# Patient Record
Sex: Male | Born: 1950 | Race: White | Hispanic: No | Marital: Single | Smoking: Never smoker
Health system: Southern US, Community
[De-identification: ages and names within clinical notes are randomized; demographics above are authoritative.]

## PROBLEM LIST (undated history)

## (undated) DIAGNOSIS — S069X9A Unspecified intracranial injury with loss of consciousness of unspecified duration, initial encounter: Secondary | ICD-10-CM

## (undated) DIAGNOSIS — S069XAA Unspecified intracranial injury with loss of consciousness status unknown, initial encounter: Secondary | ICD-10-CM

## (undated) DIAGNOSIS — F29 Unspecified psychosis not due to a substance or known physiological condition: Secondary | ICD-10-CM

## (undated) DIAGNOSIS — I10 Essential (primary) hypertension: Secondary | ICD-10-CM

## (undated) DIAGNOSIS — F419 Anxiety disorder, unspecified: Secondary | ICD-10-CM

## (undated) HISTORY — PX: BRAIN SURGERY: SHX531

## (undated) HISTORY — DX: Unspecified psychosis not due to a substance or known physiological condition: F29

## (undated) HISTORY — PX: COLONOSCOPY: SHX174

## (undated) HISTORY — DX: Essential (primary) hypertension: I10

---

## 2006-10-13 DIAGNOSIS — S82409A Unspecified fracture of shaft of unspecified fibula, initial encounter for closed fracture: Secondary | ICD-10-CM | POA: Insufficient documentation

## 2007-01-16 DIAGNOSIS — Z8719 Personal history of other diseases of the digestive system: Secondary | ICD-10-CM

## 2007-02-03 LAB — CONVERTED CEMR LAB
BUN: 16 mg/dL
Creatinine, Ser: 1.09 mg/dL
Eosinophils Absolute: 7.8 10*3/uL
Lymphocytes Relative: 27.2 %
Lymphs Abs: 2.9 10*3/uL
MCV: 88.1 fL
Platelets: 296 10*3/uL
Sodium: 141 meq/L
Triglycerides: 163 mg/dL
WBC: 10.8 10*3/uL

## 2007-02-24 ENCOUNTER — Encounter: Payer: Self-pay | Admitting: Gastroenterology

## 2007-05-01 ENCOUNTER — Encounter (INDEPENDENT_AMBULATORY_CARE_PROVIDER_SITE_OTHER): Payer: Self-pay | Admitting: Nurse Practitioner

## 2007-05-29 ENCOUNTER — Ambulatory Visit: Payer: Self-pay | Admitting: Nurse Practitioner

## 2007-05-29 DIAGNOSIS — F79 Unspecified intellectual disabilities: Secondary | ICD-10-CM

## 2007-05-29 DIAGNOSIS — I10 Essential (primary) hypertension: Secondary | ICD-10-CM

## 2007-05-29 DIAGNOSIS — E119 Type 2 diabetes mellitus without complications: Secondary | ICD-10-CM

## 2007-05-29 DIAGNOSIS — G939 Disorder of brain, unspecified: Secondary | ICD-10-CM | POA: Insufficient documentation

## 2007-05-29 LAB — CONVERTED CEMR LAB
Albumin: 4.3 g/dL (ref 3.5–5.2)
Alkaline Phosphatase: 89 units/L (ref 39–117)
BUN: 13 mg/dL (ref 6–23)
Basophils Relative: 0 % (ref 0–1)
Blood Glucose, Fingerstick: 82
CO2: 21 meq/L (ref 19–32)
Chloride: 109 meq/L (ref 96–112)
Creatinine, Ser: 1.06 mg/dL (ref 0.40–1.50)
Glucose, Bld: 67 mg/dL — ABNORMAL LOW (ref 70–99)
HCT: 47.6 % (ref 39.0–52.0)
HDL: 29 mg/dL — ABNORMAL LOW (ref >39)
Hgb A1c MFr Bld: 4.7 %
LDL Cholesterol: 67 mg/dL (ref 0–99)
Lymphs Abs: 2.9 10*3/uL (ref 0.7–4.0)
Microalb, Ur: 1.37 mg/dL (ref 0.00–1.89)
Monocytes Absolute: 0.7 10*3/uL (ref 0.1–1.0)
Monocytes Relative: 6 % (ref 3–12)
Potassium: 4 meq/L (ref 3.5–5.3)
RBC: 5.13 M/uL (ref 4.22–5.81)
RDW: 13.9 % (ref 11.5–15.5)
Total Bilirubin: 0.5 mg/dL (ref 0.3–1.2)
Total CHOL/HDL Ratio: 4.1
Total Protein: 7.3 g/dL (ref 6.0–8.3)
WBC: 10.3 10*3/uL (ref 4.0–10.5)

## 2007-05-30 ENCOUNTER — Encounter (INDEPENDENT_AMBULATORY_CARE_PROVIDER_SITE_OTHER): Payer: Self-pay | Admitting: Nurse Practitioner

## 2007-06-06 ENCOUNTER — Encounter (INDEPENDENT_AMBULATORY_CARE_PROVIDER_SITE_OTHER): Payer: Self-pay | Admitting: Nurse Practitioner

## 2007-07-05 ENCOUNTER — Encounter (INDEPENDENT_AMBULATORY_CARE_PROVIDER_SITE_OTHER): Payer: Self-pay | Admitting: Nurse Practitioner

## 2007-08-03 ENCOUNTER — Encounter (INDEPENDENT_AMBULATORY_CARE_PROVIDER_SITE_OTHER): Payer: Self-pay | Admitting: Nurse Practitioner

## 2007-09-04 ENCOUNTER — Encounter (INDEPENDENT_AMBULATORY_CARE_PROVIDER_SITE_OTHER): Payer: Self-pay | Admitting: Nurse Practitioner

## 2007-10-06 ENCOUNTER — Ambulatory Visit: Payer: Self-pay | Admitting: Nurse Practitioner

## 2007-10-06 LAB — CONVERTED CEMR LAB
Alkaline Phosphatase: 81 units/L (ref 39–117)
Basophils Relative: 0 % (ref 0–1)
CO2: 25 meq/L (ref 19–32)
Calcium: 9.5 mg/dL (ref 8.4–10.5)
Eosinophils Absolute: 0.2 10*3/uL (ref 0.0–0.7)
Eosinophils Relative: 1 % (ref 0–5)
HCT: 43.8 % (ref 39.0–52.0)
Hemoglobin: 14.5 g/dL (ref 13.0–17.0)
Lymphs Abs: 2.5 10*3/uL (ref 0.7–4.0)
MCHC: 33.1 g/dL (ref 30.0–36.0)
MCV: 90.7 fL (ref 78.0–100.0)
Monocytes Relative: 6 % (ref 3–12)
Neutro Abs: 7.2 10*3/uL (ref 1.7–7.7)
Neutrophils Relative %: 69 % (ref 43–77)
Platelets: 276 10*3/uL (ref 150–400)
RBC: 4.83 M/uL (ref 4.22–5.81)
RDW: 13.3 % (ref 11.5–15.5)
Sodium: 141 meq/L (ref 135–145)
Total Protein: 7.2 g/dL (ref 6.0–8.3)

## 2007-10-08 DIAGNOSIS — K5909 Other constipation: Secondary | ICD-10-CM

## 2007-10-09 ENCOUNTER — Encounter (INDEPENDENT_AMBULATORY_CARE_PROVIDER_SITE_OTHER): Payer: Self-pay | Admitting: Nurse Practitioner

## 2007-10-10 ENCOUNTER — Encounter (INDEPENDENT_AMBULATORY_CARE_PROVIDER_SITE_OTHER): Payer: Self-pay | Admitting: Internal Medicine

## 2007-11-27 ENCOUNTER — Ambulatory Visit: Payer: Self-pay | Admitting: Nurse Practitioner

## 2007-11-27 DIAGNOSIS — R822 Biliuria: Secondary | ICD-10-CM

## 2007-11-27 DIAGNOSIS — E669 Obesity, unspecified: Secondary | ICD-10-CM

## 2007-11-27 LAB — CONVERTED CEMR LAB
AST: 17 units/L (ref 0–37)
Albumin: 4.1 g/dL (ref 3.5–5.2)
Alkaline Phosphatase: 78 units/L (ref 39–117)
Bilirubin, Direct: 0.2 mg/dL (ref 0.0–0.3)
HDL: 27 mg/dL — ABNORMAL LOW (ref >39)
Hep A Total Ab: NEGATIVE
Indirect Bilirubin: 0.5 mg/dL (ref 0.0–0.9)
LDL Cholesterol: 72 mg/dL (ref 0–99)
Nitrite: NEGATIVE
OCCULT 1: NEGATIVE
PSA: 1.32 ng/mL (ref 0.10–4.00)
TSH: 1.097 microintl units/mL (ref 0.350–4.50)
Total Bilirubin: 0.7 mg/dL (ref 0.3–1.2)
Total Protein: 7 g/dL (ref 6.0–8.3)
Triglycerides: 91 mg/dL (ref <150)
VLDL: 18 mg/dL (ref 0–40)

## 2007-11-28 ENCOUNTER — Telehealth (INDEPENDENT_AMBULATORY_CARE_PROVIDER_SITE_OTHER): Payer: Self-pay | Admitting: Nurse Practitioner

## 2007-11-30 ENCOUNTER — Encounter (INDEPENDENT_AMBULATORY_CARE_PROVIDER_SITE_OTHER): Payer: Self-pay | Admitting: Nurse Practitioner

## 2007-12-11 ENCOUNTER — Encounter (INDEPENDENT_AMBULATORY_CARE_PROVIDER_SITE_OTHER): Payer: Self-pay | Admitting: Internal Medicine

## 2008-02-02 ENCOUNTER — Encounter (INDEPENDENT_AMBULATORY_CARE_PROVIDER_SITE_OTHER): Payer: Self-pay | Admitting: Nurse Practitioner

## 2008-02-06 ENCOUNTER — Encounter: Payer: Self-pay | Admitting: Gastroenterology

## 2008-02-07 ENCOUNTER — Encounter (INDEPENDENT_AMBULATORY_CARE_PROVIDER_SITE_OTHER): Payer: Self-pay | Admitting: Nurse Practitioner

## 2008-02-20 ENCOUNTER — Telehealth (INDEPENDENT_AMBULATORY_CARE_PROVIDER_SITE_OTHER): Payer: Self-pay | Admitting: Nurse Practitioner

## 2008-02-22 ENCOUNTER — Encounter (INDEPENDENT_AMBULATORY_CARE_PROVIDER_SITE_OTHER): Payer: Self-pay | Admitting: Nurse Practitioner

## 2008-03-11 ENCOUNTER — Ambulatory Visit: Payer: Self-pay | Admitting: Gastroenterology

## 2008-03-13 ENCOUNTER — Telehealth: Payer: Self-pay | Admitting: Gastroenterology

## 2008-03-14 ENCOUNTER — Encounter (INDEPENDENT_AMBULATORY_CARE_PROVIDER_SITE_OTHER): Payer: Self-pay | Admitting: Nurse Practitioner

## 2008-04-02 ENCOUNTER — Telehealth: Payer: Self-pay | Admitting: Gastroenterology

## 2008-04-03 ENCOUNTER — Encounter (INDEPENDENT_AMBULATORY_CARE_PROVIDER_SITE_OTHER): Payer: Self-pay | Admitting: Nurse Practitioner

## 2008-04-17 ENCOUNTER — Encounter (INDEPENDENT_AMBULATORY_CARE_PROVIDER_SITE_OTHER): Payer: Self-pay | Admitting: *Deleted

## 2008-04-17 ENCOUNTER — Telehealth: Payer: Self-pay | Admitting: Gastroenterology

## 2008-04-29 ENCOUNTER — Encounter (INDEPENDENT_AMBULATORY_CARE_PROVIDER_SITE_OTHER): Payer: Self-pay | Admitting: Nurse Practitioner

## 2008-05-27 ENCOUNTER — Ambulatory Visit: Payer: Self-pay | Admitting: Nurse Practitioner

## 2008-05-27 DIAGNOSIS — Z862 Personal history of diseases of the blood and blood-forming organs and certain disorders involving the immune mechanism: Secondary | ICD-10-CM

## 2008-05-27 DIAGNOSIS — Z8639 Personal history of other endocrine, nutritional and metabolic disease: Secondary | ICD-10-CM

## 2008-05-27 LAB — CONVERTED CEMR LAB: Blood Glucose, Fingerstick: 87

## 2008-05-29 ENCOUNTER — Telehealth (INDEPENDENT_AMBULATORY_CARE_PROVIDER_SITE_OTHER): Payer: Self-pay | Admitting: Nurse Practitioner

## 2008-06-03 LAB — CONVERTED CEMR LAB
ALT: 14 units/L (ref 0–53)
Albumin: 4.1 g/dL (ref 3.5–5.2)
BUN: 22 mg/dL (ref 6–23)
CO2: 22 meq/L (ref 19–32)
Creatinine, Ser: 0.97 mg/dL (ref 0.40–1.50)
Potassium: 4.9 meq/L (ref 3.5–5.3)
Sodium: 148 meq/L — ABNORMAL HIGH (ref 135–145)

## 2008-06-11 ENCOUNTER — Encounter (INDEPENDENT_AMBULATORY_CARE_PROVIDER_SITE_OTHER): Payer: Self-pay | Admitting: Nurse Practitioner

## 2008-10-07 ENCOUNTER — Ambulatory Visit: Payer: Self-pay | Admitting: Nurse Practitioner

## 2008-10-10 ENCOUNTER — Telehealth (INDEPENDENT_AMBULATORY_CARE_PROVIDER_SITE_OTHER): Payer: Self-pay | Admitting: Nurse Practitioner

## 2008-10-18 ENCOUNTER — Ambulatory Visit: Payer: Self-pay | Admitting: Nurse Practitioner

## 2008-10-21 ENCOUNTER — Ambulatory Visit: Payer: Self-pay | Admitting: Nurse Practitioner

## 2008-10-31 ENCOUNTER — Encounter (INDEPENDENT_AMBULATORY_CARE_PROVIDER_SITE_OTHER): Payer: Self-pay | Admitting: Nurse Practitioner

## 2009-01-07 ENCOUNTER — Encounter: Payer: Self-pay | Admitting: Gastroenterology

## 2009-01-10 ENCOUNTER — Encounter (INDEPENDENT_AMBULATORY_CARE_PROVIDER_SITE_OTHER): Payer: Self-pay | Admitting: *Deleted

## 2009-04-24 ENCOUNTER — Encounter (INDEPENDENT_AMBULATORY_CARE_PROVIDER_SITE_OTHER): Payer: Self-pay | Admitting: *Deleted

## 2009-05-30 ENCOUNTER — Encounter (INDEPENDENT_AMBULATORY_CARE_PROVIDER_SITE_OTHER): Payer: Self-pay | Admitting: *Deleted

## 2009-06-02 ENCOUNTER — Ambulatory Visit: Payer: Self-pay | Admitting: Gastroenterology

## 2009-06-17 ENCOUNTER — Ambulatory Visit: Payer: Self-pay | Admitting: Gastroenterology

## 2010-02-15 ENCOUNTER — Encounter: Payer: Self-pay | Admitting: Internal Medicine

## 2010-02-24 NOTE — Procedures (Signed)
Summary: Recall / [place] [name]  Recall / Lance [name]   Imported By: Lance [name] 06/26/2009 17:11:20  _____________________________________________________________________  External Attachment:    Type:   Image     Comment:   External Document

## 2010-02-24 NOTE — Letter (Signed)
Summary: Previsit letter  [facility]  [address] [place], [place] [address]   Phone: [phone]  Fax: [phone]       04/24/2009 MRN: [id number]  attn: [name] [name] [address] [place], [place]  [address]  Dear Lance [name],  Welcome to the Gastroenterology Division at [facility].    You are scheduled to see a nurse for your pre-procedure visit on 06/02/2009 at 11:00AM on the 3rd floor at [facility], [address]  We ask that you try to arrive at our office 15 minutes prior to your appointment time to allow for check-in.  *Please have POA present at this meeting as well as colonoscopy.  Your nurse visit will consist of discussing your medical and surgical history, your immediate family medical history, and your medications.    Please bring a complete list of all your medications or, if you prefer, bring the medication bottles and we will list them.  We will need to be aware of both prescribed and over the counter drugs.  We will need to know exact dosage information as well.  If you are on blood thinners (Coumadin, Plavix, Aggrenox, Ticlid, etc.) please call our office today/prior to your appointment, as we need to consult with your physician about holding your medication.   Please be prepared to read and sign documents such as consent forms, a financial agreement, and acknowledgement forms.  If necessary, and with your consent, a friend or relative is welcome to sit-in on the nurse visit with you.  Please bring your insurance card so that we may make a copy of it.  If your insurance requires a referral to see a specialist, please bring your referral form from your primary care physician.  No co-pay is required for this nurse visit.     If you cannot keep your appointment, please call [phone] to cancel or reschedule prior to your appointment date.  This allows [place] the opportunity to schedule an appointment for another patient in need of  care.    Thank you for choosing [facility] for your medical needs.  We appreciate the opportunity to care for you.  Please visit [place] at our website  to learn more about our practice.                     Sincerely.                                                                                                                   The Gastroenterology Division

## 2010-02-24 NOTE — Letter (Signed)
Summary: [facility] Instructions  [facility]  [address] [place], [place] [address]   Phone: [phone]  Fax: [phone]       Lance [name]    September 07, 1950    MRN: [id number]        Procedure Day Lance [name]:  Lance [name] 06/17/09     Arrival Time:  9:00am     Procedure Time:  10:00am     Location of Procedure:                    Lance [name]  [facility] (4th Floor)                        PREPARATION FOR COLONOSCOPY WITH MOVIPREP   Starting 5 days prior to your procedure  THURSDAY 05/29/17  do not eat nuts, seeds, popcorn, corn, beans, peas,  salads, or any raw vegetables.  Do not take any fiber supplements (e.g. Metamucil, Citrucel, and Benefiber).  THE DAY BEFORE YOUR PROCEDURE         DATE: 05/23   DAY: MONDAY  1.  Drink clear liquids the entire day-NO SOLID FOOD  2.  Do not drink anything colored red or purple.  Avoid juices with pulp.  No orange juice.  3.  Drink at least 64 oz. (8 glasses) of fluid/clear liquids during the day to prevent dehydration and help the prep work efficiently.  CLEAR LIQUIDS INCLUDE: Water Jello Ice Popsicles Tea (sugar ok, no milk/cream) Powdered fruit flavored drinks Coffee (sugar ok, no milk/cream) Gatorade Juice: apple, white grape, white cranberry  Lemonade Clear bullion, consomm, broth Carbonated beverages (any kind) Strained chicken noodle soup Hard Candy                             4.  In the morning, mix first dose of MoviPrep solution:    Empty 1 Pouch A and 1 Pouch B into the disposable container    Add lukewarm drinking water to the top line of the container. Mix to dissolve    Refrigerate (mixed solution should be used within 24 hrs)  5.  Begin drinking the prep at 5:00 p.m. The MoviPrep container is divided by 4 marks.   Every 15 minutes drink the solution down to the next mark (approximately 8 oz) until the full liter is complete.   6.  Follow completed prep with 16 oz of clear liquid of your  choice (Nothing red or purple).  Continue to drink clear liquids until bedtime.  7.  Before going to bed, mix second dose of MoviPrep solution:    Empty 1 Pouch A and 1 Pouch B into the disposable container    Add lukewarm drinking water to the top line of the container. Mix to dissolve    Refrigerate  THE DAY OF YOUR PROCEDURE      DATE:  05/24  DAY: TUESDAY  Beginning at  5:00 a.m. (5 hours before procedure):         1. Every 15 minutes, drink the solution down to the next mark (approx 8 oz) until the full liter is complete.  2. Follow completed prep with 16 oz. of clear liquid of your choice.    3. You may drink clear liquids until  8:00am  (2 HOURS BEFORE PROCEDURE).   MEDICATION INSTRUCTIONS  Unless otherwise instructed, you should take regular prescription medications with a small sip of water  as early as possible the morning of your procedure.         OTHER INSTRUCTIONS  You will need a responsible adult at least 60 years of age to accompany you and drive you home.   This person must remain in the waiting room during your procedure.  Wear loose fitting clothing that is easily removed.  Leave jewelry and other valuables at home.  However, you may wish to bring a book to read or  an iPod/MP3 player to listen to music as you wait for your procedure to start.  Remove all body piercing jewelry and leave at home.  Total time from sign-in until discharge is approximately 2-3 hours.  You should go home directly after your procedure and rest.  You can resume normal activities the  day after your procedure.  The day of your procedure you should not:   Drive   Make legal decisions   Operate machinery   Drink alcohol   Return to work  You will receive specific instructions about eating, activities and medications before you leave.    The above instructions have been reviewed and explained to me by   Lance Almas RN  Jun 02, 2009 12:20 PM     I fully  understand and can verbalize these instructions _____________________________ Date _________   Appended Document: Moviprep Instructions Five days before procedure is 06/12/09.  Corrected on pt.s copy of instructions.

## 2010-02-24 NOTE — Procedures (Signed)
Summary: Colonoscopy  Patient: [name] Note: All result statuses are Final unless otherwise noted.  Tests: (1) Colonoscopy (COL)   COL Colonoscopy           DONE     [facility]     [address]     [place], [place]  [address]           COLONOSCOPY PROCEDURE REPORT           PATIENT:  Lance [name], Lance [name]  MR#:  [id number]     BIRTHDATE:  11-18-1950, 58 yrs. old  GENDER:  male     ENDOSCOPIST:  Rachael Fee, MD     REFERRED BY:  Shirlean Schlein, MD     PROCEDURE DATE:  06/17/2009     PROCEDURE:  Diagnostic Colonoscopy     ASA CLASS:  Class II     INDICATIONS:  Routine Risk Screening     MEDICATIONS:   Fentanyl 50 mcg IV, Versed 6 mg IV           DESCRIPTION OF PROCEDURE:   After the risks benefits and     alternatives of the procedure were thoroughly explained, informed     consent was obtained.  Digital rectal exam was performed and     revealed no rectal masses.   The LB CF-H180AL [id number] endoscope     was introduced through the anus and advanced to the cecum, which     was identified by both the appendix and ileocecal valve, limited     by poor preparation.    The quality of the prep was poor, using     MoviPrep.  The instrument was then slowly withdrawn as the colon     was fully examined.     <<PROCEDUREIMAGES>>           FINDINGS: There was a large amount of retained liquid stool with     some small particles of solid stool. This limited the examination     greatly. No obstructing cancers were noted but small lesions could     have been missed (see image1, image2, image3, image4, and image5).     Retroflexed views in the rectum revealed no abnormalities.    The     scope was then withdrawn from the patient and the procedure     completed.           COMPLICATIONS:  None     ENDOSCOPIC IMPRESSION:     1) Poor prep (exam limited)     2) No obstructing cancers, but small lesions could have been     missed.           RECOMMENDATIONS:     He will  need repeat colonoscopy in 1 year.  Will need [facility] MORE     supervision to help him adequately prep at that time.           REPEAT EXAM:  1 year           ______________________________     Rachael Fee, MD           n.     eSIGNED:   Rachael Fee at 06/17/2009 10:55 AM           Vaughan Basta, [phone]  Note: An exclamation mark (!) indicates a result that was not dispersed into the flowsheet. Document Creation Date: 06/17/2009 10:56 AM _______________________________________________________________________  (1) Order result status: Final Collection or observation date-time: 06/17/2009  10:49 Requested date-time:  Receipt date-time:  Reported date-time:  Referring Physician:   Ordering Physician: Rob Bunting [phone]) Specimen Source:  Source: Launa Grill Order Number: [id number] Lab site:   Appended Document: Colonoscopy    Clinical Lists Changes  Observations: Added new observation of COLONNXTDUE: 05/2010 (06/17/2009 11:15)

## 2010-02-24 NOTE — Miscellaneous (Signed)
Summary: [facility] Previsit/prep  Clinical Lists Changes  Medications: Added new medication of MOVIPREP 100 GM  SOLR (PEG-KCL-NACL-NASULF-NA ASC-C) As per prep instructions. - Signed Rx of MOVIPREP 100 GM  SOLR (PEG-KCL-NACL-NASULF-NA ASC-C) As per prep instructions.;  #1 x 0;  Signed;  Entered by: [name] RN;  Authorized by: Rachael Fee MD;  Method used: Print then Give to Patient Observations: Added new observation of NKA: T (06/02/2009 11:56)    Prescriptions: MOVIPREP 100 GM  SOLR (PEG-KCL-NACL-NASULF-NA ASC-C) As per prep instructions.  #1 x 0   Entered by:   [name] RN   Authorized by:   Rachael Fee MD   Signed by:   [name] RN on 06/02/2009   Method used:   Print then Give to Patient   RxID:   [id number]

## 2010-02-25 ENCOUNTER — Emergency Department (HOSPITAL_BASED_OUTPATIENT_CLINIC_OR_DEPARTMENT_OTHER)
Admission: EM | Admit: 2010-02-25 | Discharge: 2010-02-25 | Discharge status: Against Medical Advice | Payer: Medicare Other | Attending: Emergency Medicine | Admitting: Emergency Medicine

## 2010-02-25 DIAGNOSIS — I10 Essential (primary) hypertension: Secondary | ICD-10-CM | POA: Insufficient documentation

## 2010-06-03 ENCOUNTER — Ambulatory Visit (AMBULATORY_SURGERY_CENTER): Payer: Medicare Other | Admitting: *Deleted

## 2010-06-03 VITALS — Ht 70.0 in | Wt 172.2 lb

## 2010-06-03 DIAGNOSIS — Z1211 Encounter for screening for malignant neoplasm of colon: Secondary | ICD-10-CM

## 2010-06-03 NOTE — Progress Notes (Signed)
Pt's brother is P.O.A. and per Dr.Jacobs brother to sign paperwork today and it is ok for aide from group home to be present at procedure as brother can not come.Last Prep was very poor so new orders rec'd from  Dr.Jacobs for additional prep.Given MoviPrep  Sample kits x 2.Marland Kitchen

## 2010-06-10 ENCOUNTER — Telehealth: Payer: Self-pay

## 2010-06-10 DIAGNOSIS — Z1211 Encounter for screening for malignant neoplasm of colon: Secondary | ICD-10-CM

## 2010-06-10 NOTE — Telephone Encounter (Signed)
Pt colon changed to [facility] 08/08/10 propofol  Pre admit appt 08/06/10 noon pt reinstructed new instruction  Mailed I spoke with Melody at [phone].

## 2010-06-10 NOTE — Telephone Encounter (Signed)
Message copied by Chales Abrahams on Wed Jun 10, 2010  8:44 AM ------      Message from: Rob Bunting      Created: Tue Jun 09, 2010  7:30 AM       i did him a year ago in [facility] with only 50 fent and 5 versed and it went OK, but given his meds and diagnoses it is probably best to do at [facility] with propofol.  Thanks                  ----- Message -----         From: Chales Abrahams, CMA         Sent: 06/08/2010   4:52 PM           To: Rob Bunting, MD            [facility] sent me a note and wants to know if the pt needs to be done with propofol because of his brain injury, psychosis, and is on a large dose of Thorazine and ativan.

## 2010-06-19 ENCOUNTER — Other Ambulatory Visit: Payer: Medicaid Other | Admitting: Gastroenterology

## 2010-07-07 ENCOUNTER — Other Ambulatory Visit (HOSPITAL_COMMUNITY): Payer: Medicare Other

## 2010-07-08 ENCOUNTER — Telehealth: Payer: Self-pay | Admitting: *Deleted

## 2010-07-08 NOTE — Telephone Encounter (Signed)
Arlys John from [facility] called to report pt did not show up yesterday for Propofol eval anf the group home/facility cannot bring him today. Pt was given 7.12.12 as a pre visit day. Can I change with another MAC pt if they are in agreement? Thanks.

## 2010-07-08 NOTE — Telephone Encounter (Signed)
Switched pt's appt to 10:15am at [facility]. Spoke with Melody at [phone] to arrange this. Pt will check in around 0800 am in order to be evaluated for MAC sedation. Melody will also make sure pt does 2 sets of Movi Prep. She will call for problems.

## 2010-07-08 NOTE — Telephone Encounter (Signed)
Yes, thanks

## 2010-07-09 ENCOUNTER — Other Ambulatory Visit: Payer: Medicare Other | Admitting: Gastroenterology

## 2010-07-09 ENCOUNTER — Ambulatory Visit (HOSPITAL_COMMUNITY)
Admission: RE | Admit: 2010-07-09 | Discharge: 2010-07-09 | Disposition: A | Discharge status: Home / Self Care | Payer: Medicare Other | Source: Ambulatory Visit | Attending: Gastroenterology | Admitting: Gastroenterology

## 2010-07-09 DIAGNOSIS — Z01812 Encounter for preprocedural laboratory examination: Secondary | ICD-10-CM | POA: Insufficient documentation

## 2010-07-09 DIAGNOSIS — Z1211 Encounter for screening for malignant neoplasm of colon: Secondary | ICD-10-CM

## 2010-07-09 DIAGNOSIS — F79 Unspecified intellectual disabilities: Secondary | ICD-10-CM | POA: Insufficient documentation

## 2010-07-09 DIAGNOSIS — E119 Type 2 diabetes mellitus without complications: Secondary | ICD-10-CM | POA: Insufficient documentation

## 2010-07-09 DIAGNOSIS — I10 Essential (primary) hypertension: Secondary | ICD-10-CM | POA: Insufficient documentation

## 2010-07-09 LAB — GLUCOSE, CAPILLARY

## 2010-11-14 ENCOUNTER — Emergency Department (HOSPITAL_COMMUNITY)
Admission: EM | Admit: 2010-11-14 | Discharge: 2010-11-14 | Disposition: A | Discharge status: Home / Self Care | Payer: Medicare Other | Attending: Emergency Medicine | Admitting: Emergency Medicine

## 2010-11-14 ENCOUNTER — Emergency Department (HOSPITAL_BASED_OUTPATIENT_CLINIC_OR_DEPARTMENT_OTHER)
Admission: EM | Admit: 2010-11-14 | Discharge: 2010-11-14 | Disposition: A | Discharge status: Other Acute Inpatient Hospital | Payer: Medicare Other | Attending: Emergency Medicine | Admitting: Emergency Medicine

## 2010-11-14 ENCOUNTER — Emergency Department (HOSPITAL_COMMUNITY): Payer: Medicare Other

## 2010-11-14 ENCOUNTER — Encounter (HOSPITAL_BASED_OUTPATIENT_CLINIC_OR_DEPARTMENT_OTHER): Payer: Self-pay | Admitting: *Deleted

## 2010-11-14 DIAGNOSIS — N390 Urinary tract infection, site not specified: Secondary | ICD-10-CM | POA: Insufficient documentation

## 2010-11-14 DIAGNOSIS — R4182 Altered mental status, unspecified: Secondary | ICD-10-CM | POA: Insufficient documentation

## 2010-11-14 DIAGNOSIS — N453 Epididymo-orchitis: Secondary | ICD-10-CM | POA: Insufficient documentation

## 2010-11-14 DIAGNOSIS — N5089 Other specified disorders of the male genital organs: Secondary | ICD-10-CM | POA: Insufficient documentation

## 2010-11-14 DIAGNOSIS — I10 Essential (primary) hypertension: Secondary | ICD-10-CM | POA: Insufficient documentation

## 2010-11-14 DIAGNOSIS — N433 Hydrocele, unspecified: Secondary | ICD-10-CM | POA: Insufficient documentation

## 2010-11-14 LAB — BASIC METABOLIC PANEL
BUN: 16 mg/dL (ref 6–23)
Chloride: 99 mEq/L (ref 96–112)
GFR calc Af Amer: >90 mL/min (ref >90)
GFR calc non Af Amer: >90 mL/min (ref >90)
Potassium: 4 mEq/L (ref 3.5–5.1)
Sodium: 137 mEq/L (ref 135–145)

## 2010-11-14 LAB — CBC
HCT: 35.7 % — ABNORMAL LOW (ref 39.0–52.0)
MCHC: 34.2 g/dL (ref 30.0–36.0)
Platelets: 267 10*3/uL (ref 150–400)
RDW: 12.6 % (ref 11.5–15.5)
WBC: 28.7 10*3/uL — ABNORMAL HIGH (ref 4.0–10.5)

## 2010-11-14 LAB — URINE MICROSCOPIC-ADD ON

## 2010-11-14 LAB — URINALYSIS, ROUTINE W REFLEX MICROSCOPIC
Glucose, UA: NEGATIVE mg/dL
Ketones, ur: 15 mg/dL — AB
Nitrite: POSITIVE — AB
Protein, ur: 30 mg/dL — AB
Urobilinogen, UA: 1 mg/dL (ref 0.0–1.0)

## 2010-11-14 MED ORDER — DEXTROSE 5 % IV SOLN
1.0000 g | Freq: Once | INTRAVENOUS | Status: AC
  Administered 2010-11-14: 1 g via INTRAVENOUS
  Filled 2010-11-14: qty 1

## 2010-11-14 MED ORDER — LEVOFLOXACIN IN D5W 500 MG/100ML IV SOLN
500.0000 mg | INTRAVENOUS | Status: DC
  Filled 2010-11-14: qty 100

## 2010-11-14 MED ORDER — SODIUM CHLORIDE 0.9 % IV SOLN
Freq: Once | INTRAVENOUS | Status: AC
  Administered 2010-11-14: 13:00:00 via INTRAVENOUS

## 2010-11-14 NOTE — ED Provider Notes (Signed)
History/physical exam/procedure(s) were performed by non-physician practitioner and as supervising physician I was immediately available for consultation/collaboration. I have reviewed all notes and am in agreement with care and plan. I saw patient and examined and he has tender right swollen mass in the the right scrotum  Lance Quarry, MD 11/14/10 [phone]

## 2010-11-14 NOTE — ED Notes (Signed)
Pt brought to ED from group home. Here with group home mgr which states that she was told pt's right groin was swollen and that he had a fever of 102.0.

## 2010-11-14 NOTE — ED Provider Notes (Signed)
History     CSN: [id number] Arrival date & time: 11/14/2010 11:39 AM   First MD Initiated Contact with Patient 11/14/10 1212      Chief Complaint  Patient presents with  . Groin Swelling    (Consider location/radiation/quality/duration/timing/severity/associated sxs/prior treatment) HPI Comments: Pt here with group home providers and they say that pt stated to c/o pain this morning and that his temperature was as high as 102  Patient is a 60 y.o. male presenting with testicular pain. The history is provided by the patient and a caregiver. No language interpreter was used.  Testicle Pain This is a new problem. The current episode started today. The problem occurs constantly. The problem has been unchanged. Associated symptoms include a fever. Pertinent negatives include no abdominal pain, nausea or vomiting. Exacerbated by: palpation. He has tried nothing for the symptoms.    Past Medical History  Diagnosis Date  . Hypertension   . Psychotic disorder     Past Surgical History  Procedure Date  . Colonoscopy     History reviewed. No pertinent family history.  History  Substance Use Topics  . Smoking status: Never Smoker   . Smokeless tobacco: Never Used  . Alcohol Use: No      Review of Systems  Constitutional: Positive for fever.  Gastrointestinal: Negative for nausea, vomiting and abdominal pain.  Genitourinary: Positive for testicular pain.  All other systems reviewed and are negative.    Allergies  Review of patient's allergies indicates no known allergies.  Home Medications   Current Outpatient Rx  Name Route Sig Dispense Refill  . CHLORPROMAZINE HCL 50 MG PO TABS Oral Take 50 mg by mouth at bedtime. Takes 5 tablets     . LISINOPRIL 20 MG PO TABS Oral Take 20 mg by mouth daily.      Marland Kitchen LORAZEPAM 0.5 MG PO TABS Oral Take 0.5 mg by mouth 2 (two) times daily before a meal.        BP 129/62  Pulse 100  Temp(Src) 98.3 F (36.8 C) (Oral)  Resp 24  Ht 5'  10" (1.778 m)  Wt 171 lb (77.565 kg)  BMI 24.54 kg/m2  SpO2 96%  Physical Exam  Nursing note and vitals reviewed. Constitutional: He appears well-nourished.  HENT:  Head: Normocephalic.  Eyes: Pupils are equal, round, and reactive to light.  Cardiovascular: Normal rate and regular rhythm.   Pulmonary/Chest: Effort normal and breath sounds normal.  Abdominal: Soft. Bowel sounds are normal.  Genitourinary:       Pt has large right testicle, with redness noted to the area:pt is tender to palpation:no noticeable left testicle able to be palpated  Neurological: He is alert.  Skin: Skin is warm and dry.    ED Course  Procedures (including critical care time)   Labs Reviewed  URINALYSIS, ROUTINE W REFLEX MICROSCOPIC  CBC  BASIC METABOLIC PANEL   No results found.   1. Testicular swelling   2. UTI (urinary tract infection)       MDM  Pt to be sent over to [facility] for an ultrasound to r/o infection vs hernia:pt excepted by Dr. Radford Pax to the ER:pt was given a dose of rocephin here in the er        Teressa Lower, NP 11/14/10 1300  Teressa Lower, NP 11/14/10 1556

## 2011-03-26 ENCOUNTER — Emergency Department (HOSPITAL_COMMUNITY)
Admission: EM | Admit: 2011-03-26 | Discharge: 2011-03-26 | Disposition: A | Discharge status: Home / Self Care | Payer: Medicare Other | Attending: Emergency Medicine | Admitting: Emergency Medicine

## 2011-03-26 ENCOUNTER — Other Ambulatory Visit: Payer: Self-pay

## 2011-03-26 DIAGNOSIS — R42 Dizziness and giddiness: Secondary | ICD-10-CM | POA: Insufficient documentation

## 2011-03-26 DIAGNOSIS — Z79899 Other long term (current) drug therapy: Secondary | ICD-10-CM | POA: Insufficient documentation

## 2011-03-26 DIAGNOSIS — F79 Unspecified intellectual disabilities: Secondary | ICD-10-CM | POA: Insufficient documentation

## 2011-03-26 DIAGNOSIS — I10 Essential (primary) hypertension: Secondary | ICD-10-CM | POA: Insufficient documentation

## 2011-03-26 LAB — POCT I-STAT, CHEM 8
BUN: 25 mg/dL — ABNORMAL HIGH (ref 6–23)
Creatinine, Ser: 0.8 mg/dL (ref 0.50–1.35)
Glucose, Bld: 88 mg/dL (ref 70–99)
Hemoglobin: 12.9 g/dL — ABNORMAL LOW (ref 13.0–17.0)
Sodium: 145 mEq/L (ref 135–145)
TCO2: 24 mmol/L (ref 0–100)

## 2011-03-26 NOTE — ED Notes (Signed)
Labs drawn using 21 g butterfly in R FA and labled by lab tech Pt tolerated well

## 2011-03-26 NOTE — ED Notes (Signed)
Pt presernts with c/o dizziness. Has cardiac hx of "ventricular blockage" per caregiver. Was started on new medication last week. HR was slightly elevated prior to arrival.

## 2011-03-26 NOTE — ED Notes (Signed)
Pt DC'd with caregiver back to home

## 2011-03-26 NOTE — ED Provider Notes (Signed)
History     CSN: [id number]  Arrival date & time 03/26/11  4098   First MD Initiated Contact with Patient 03/26/11 [phone]      Chief Complaint  Patient presents with  . Dizziness    (Consider location/radiation/quality/duration/timing/severity/associated sxs/prior treatment) HPI This 61 year old male is a poor historian due to his mental retardation, this morning he had a transient episode of apparent dizziness which is unspecified, it is unknown whether or not it was lightheadedness versus vertigo, he had no pain during this spell and is still is now resolved. His spell of dizziness lasted several minutes. He had no difficulty breathing no cough no fever no vomiting. There is no change in his speech or vision understanding and there is no lateralizing apparent weakness or incoordination. He was able to walk during his spell. He felt dizzy this morning when he woke up but was able to take a shower. Because his pulse rate was 120 at the group home prior to arrival and he just had his blood pressure medication adjusted, the patient was sent to the ED to make sure he did not have a low blood pressure. His blood pressure at the group home was normal at the time. The patient is now back to baseline according to him and his caretaker. Past Medical History  Diagnosis Date  . Hypertension   . Psychotic disorder    mental retardation  Past Surgical History  Procedure Date  . Colonoscopy     No family history on file.  History  Substance Use Topics  . Smoking status: Never Smoker   . Smokeless tobacco: Never Used  . Alcohol Use: No   Patient lives in a group home   Review of Systems  Unable to perform ROS  mental retardation  Allergies  Review of patient's allergies indicates no known allergies.  Home Medications   Current Outpatient Rx  Name Route Sig Dispense Refill  . AMLODIPINE BESYLATE 2.5 MG PO TABS Oral Take 2.5 mg by mouth daily.    . CHLORPROMAZINE HCL 50 MG PO TABS  Oral Take 250 mg by mouth at bedtime.     Marland Kitchen LISINOPRIL 20 MG PO TABS Oral Take 20 mg by mouth daily.      Marland Kitchen LORAZEPAM 0.5 MG PO TABS Oral Take 0.5 mg by mouth 2 (two) times daily before a meal.      . POLYETHYLENE GLYCOL 3350 PO PACK Oral Take 17 g by mouth every other day.      BP 130/91  Pulse 82  Temp(Src) 97.8 F (36.6 C) (Oral)  Resp 18  SpO2 100%  Physical Exam  Nursing note and vitals reviewed. Constitutional:       Awake, alert, nontoxic appearance with baseline speech for patient.  HENT:  Head: Atraumatic.  Mouth/Throat: No oropharyngeal exudate.  Eyes: EOM are normal. Pupils are equal, round, and reactive to light. Right eye exhibits no discharge. Left eye exhibits no discharge.  Neck: Neck supple.  Cardiovascular: Normal rate and regular rhythm.   No murmur heard. Pulmonary/Chest: Effort normal and breath sounds normal. No stridor. No respiratory distress. He has no wheezes. He has no rales. He exhibits no tenderness.  Abdominal: Soft. Bowel sounds are normal. He exhibits no mass. There is no tenderness. There is no rebound.  Musculoskeletal: He exhibits no tenderness.       Baseline ROM, moves extremities with no obvious new focal weakness.  Lymphadenopathy:    He has no cervical adenopathy.  Neurological: He  is alert.       Awake, alert, cooperative and aware of situation; motor strength bilaterally; sensation normal to light touch bilaterally; peripheral visual fields full to confrontation; no facial asymmetry; tongue midline; major cranial nerves appear intact; no pronator drift, normal finger to nose bilaterally, baseline gait without new ataxia.  Skin: No rash noted.  Psychiatric: He has a normal mood and affect.    ED Course  Procedures (including critical care time) HQI:ONGEX rhythm, ventricular rate 79, normal axis, right bundle branch block, no acute ischemic changes noted, no comparison ECG available   Please note there was a mistake on vital signs entry  in the patient's record with the patient's pulse oximetry supposedly 82%, that is a mistake. His room air pulse oximetry is normal at 100%. It was his pulse rate at the time that was 82 and was inadvertently entered into the record as his pulse oximetry.  The patient remains that is baseline functioning status in the emergency department he is awake alert happy talkative and had a transient dizziness spell which I could not differentiate between lightheadedness versus vertigo due to the patient's mental retardation. He did not have any apparent lateralizing or focal neurologic symptoms he did not have syncope or trauma. Therefore I believe it is reasonable to discharge him with observation back at his group home and his caretaker agrees with this plan as well as does the patient.  Pt stable in ED with no significant deterioration in condition. Labs Reviewed  POCT I-STAT, CHEM 8 - Abnormal; Notable for the following:    BUN 25 (*)    Hemoglobin 12.9 (*)    HCT 38.0 (*)    All other components within normal limits  LAB REPORT - SCANNED   No results found.   1. Dizziness       MDM  I doubt any other EMC precluding discharge at this time including, but not necessarily limited to the following:SAH, CVA.        Hurman Horn, MD 03/28/11 [phone]

## 2012-11-27 ENCOUNTER — Inpatient Hospital Stay (HOSPITAL_COMMUNITY)
Admission: EM | Admit: 2012-11-27 | Discharge: 2012-12-01 | DRG: 351 | Disposition: A | Discharge status: Home / Self Care | Payer: Medicare Other | Attending: General Surgery | Admitting: General Surgery

## 2012-11-27 ENCOUNTER — Emergency Department (HOSPITAL_COMMUNITY): Payer: Medicare Other

## 2012-11-27 ENCOUNTER — Encounter (HOSPITAL_COMMUNITY): Payer: Self-pay | Admitting: Radiology

## 2012-11-27 DIAGNOSIS — N433 Hydrocele, unspecified: Secondary | ICD-10-CM | POA: Diagnosis present

## 2012-11-27 DIAGNOSIS — Z8719 Personal history of other diseases of the digestive system: Secondary | ICD-10-CM

## 2012-11-27 DIAGNOSIS — D72829 Elevated white blood cell count, unspecified: Secondary | ICD-10-CM | POA: Diagnosis present

## 2012-11-27 DIAGNOSIS — K559 Vascular disorder of intestine, unspecified: Secondary | ICD-10-CM | POA: Diagnosis present

## 2012-11-27 DIAGNOSIS — K4090 Unilateral inguinal hernia, without obstruction or gangrene, not specified as recurrent: Secondary | ICD-10-CM | POA: Diagnosis present

## 2012-11-27 DIAGNOSIS — I10 Essential (primary) hypertension: Secondary | ICD-10-CM | POA: Diagnosis present

## 2012-11-27 DIAGNOSIS — F72 Severe intellectual disabilities: Secondary | ICD-10-CM | POA: Diagnosis present

## 2012-11-27 DIAGNOSIS — E119 Type 2 diabetes mellitus without complications: Secondary | ICD-10-CM | POA: Diagnosis present

## 2012-11-27 DIAGNOSIS — E876 Hypokalemia: Secondary | ICD-10-CM | POA: Diagnosis not present

## 2012-11-27 DIAGNOSIS — Z87820 Personal history of traumatic brain injury: Secondary | ICD-10-CM

## 2012-11-27 DIAGNOSIS — K4030 Unilateral inguinal hernia, with obstruction, without gangrene, not specified as recurrent: Principal | ICD-10-CM | POA: Diagnosis present

## 2012-11-27 DIAGNOSIS — Z5331 Laparoscopic surgical procedure converted to open procedure: Secondary | ICD-10-CM

## 2012-11-27 LAB — BASIC METABOLIC PANEL
BUN: 19 mg/dL (ref 6–23)
Calcium: 10 mg/dL (ref 8.4–10.5)
GFR calc Af Amer: >90 mL/min (ref >90)
GFR calc non Af Amer: 86 mL/min — ABNORMAL LOW (ref >90)
Glucose, Bld: 93 mg/dL (ref 70–99)
Sodium: 135 mEq/L (ref 135–145)

## 2012-11-27 LAB — CBC WITH DIFFERENTIAL/PLATELET
Basophils Relative: 0 % (ref 0–1)
Eosinophils Absolute: 0.2 10*3/uL (ref 0.0–0.7)
Eosinophils Relative: 1 % (ref 0–5)
Lymphs Abs: 2.4 10*3/uL (ref 0.7–4.0)
MCH: 31.3 pg (ref 26.0–34.0)
MCHC: 35 g/dL (ref 30.0–36.0)
MCV: 89.5 fL (ref 78.0–100.0)
Monocytes Relative: 6 % (ref 3–12)
Platelets: 205 10*3/uL (ref 150–400)
RBC: 4.57 MIL/uL (ref 4.22–5.81)

## 2012-11-27 MED ORDER — IOHEXOL 300 MG/ML  SOLN
50.0000 mL | Freq: Once | INTRAMUSCULAR | Status: AC | PRN
  Administered 2012-11-27: 50 mL via ORAL

## 2012-11-27 MED ORDER — MORPHINE SULFATE 4 MG/ML IJ SOLN
4.0000 mg | Freq: Once | INTRAMUSCULAR | Status: AC
  Administered 2012-11-27: 4 mg via INTRAVENOUS
  Filled 2012-11-27: qty 1

## 2012-11-27 MED ORDER — SODIUM CHLORIDE 0.9 % IV SOLN
Freq: Once | INTRAVENOUS | Status: AC
  Administered 2012-11-27: 22:00:00 via INTRAVENOUS

## 2012-11-27 MED ORDER — IOHEXOL 300 MG/ML  SOLN
100.0000 mL | Freq: Once | INTRAMUSCULAR | Status: AC | PRN
  Administered 2012-11-27: 100 mL via INTRAVENOUS

## 2012-11-27 NOTE — ED Notes (Addendum)
Pt stays at [facility] [address] [facility] [address] [address] [facility] and caregivers noticed today that he has swollen L groin area that looks like a hernia. Came from Urgent Care who sent him in for suspiction of hernia and for further eval.  Unknown if he has had this before. Pt appears to be in pain.

## 2012-11-27 NOTE — ED Notes (Signed)
Patient transported to CT 

## 2012-11-27 NOTE — ED Provider Notes (Signed)
CSN: [id number]     Arrival date & time 11/27/12  1935 History   First MD Initiated Contact with Patient 11/27/12 2103     Chief Complaint  Patient presents with  . Inguinal Hernia   (Consider location/radiation/quality/duration/timing/severity/associated sxs/prior Treatment) HPI  Lance [name] is a 62 y.o. male with past medical history significant for diabetes, mental retardation, traumatic brain injury resident of [facility]  accompanied by workers from the group home who provide the history. Level V caveat secondary to severe mental retardation. Patient was complaining of belly pain today and on inspection the employee of the group home noticed that the swelling to the right inguinal area which he had seen 2 months ago at a doctor's appointment seemed larger to him. As per caretakers patient has been having normal bowel movements, no fever he does have a reduced by mouth intake but no nausea or vomiting.  Past Medical History  Diagnosis Date  . Hypertension   . Psychotic disorder    Past Surgical History  Procedure Laterality Date  . Colonoscopy     No family history on file. History  Substance Use Topics  . Smoking status: Never Smoker   . Smokeless tobacco: Never Used  . Alcohol Use: No    Review of Systems 10 systems reviewed and found to be negative, except as noted in the HPI   Allergies  Review of patient's allergies indicates no known allergies.  Home Medications   Current Outpatient Rx  Name  Route  Sig  Dispense  Refill  . amLODipine (NORVASC) 2.5 MG tablet   Oral   Take 2.5 mg by mouth daily.         . chlorproMAZINE (THORAZINE) 50 MG tablet   Oral   Take 250 mg by mouth at bedtime.          Marland Kitchen lisinopril (PRINIVIL,ZESTRIL) 20 MG tablet   Oral   Take 20 mg by mouth daily.           Marland Kitchen LORazepam (ATIVAN) 0.5 MG tablet   Oral   Take 0.5 mg by mouth 2 (two) times daily before a meal.           . polyethylene glycol (MIRALAX /  GLYCOLAX) packet   Oral   Take 17 g by mouth every other day.          BP 154/93  Pulse 78  Temp(Src) 97.5 F (36.4 C) (Oral)  Resp 20  SpO2 100% Physical Exam  Nursing note and vitals reviewed. Constitutional: He is oriented to person, place, and time. He appears well-developed and well-nourished. No distress.  HENT:  Head: Normocephalic and atraumatic.  Eyes: Conjunctivae and EOM are normal. Pupils are equal, round, and reactive to light.  Neck: Normal range of motion.  Cardiovascular: Normal rate and regular rhythm.   Pulmonary/Chest: Effort normal and breath sounds normal. No stridor. No respiratory distress. He has no wheezes. He has no rales. He exhibits no tenderness.  Abdominal: Soft. Bowel sounds are normal. He exhibits no distension and no mass. There is tenderness. There is no rebound and no guarding.  Right inguinal hernia causing swelling to the right testicle, nonreducible, mildly tender to palpation  Musculoskeletal: Normal range of motion.  Neurological: He is alert and oriented to person, place, and time.  Psychiatric: He has a normal mood and affect.    ED Course  Procedures (including critical care time) Labs Review Labs Reviewed  CBC WITH DIFFERENTIAL - Abnormal; Notable for  the following:    WBC 13.4 (*)    Neutro Abs 10.0 (*)    All other components within normal limits  BASIC METABOLIC PANEL - Abnormal; Notable for the following:    GFR calc non Af Amer 86 (*)    All other components within normal limits   Imaging Review Ct Abdomen Pelvis W Contrast  11/27/2012   CLINICAL DATA:  Swollen left groin area that looks like a hernia.  EXAM: CT ABDOMEN AND PELVIS WITH CONTRAST  TECHNIQUE: Multidetector CT imaging of the abdomen and pelvis was performed using the standard protocol following bolus administration of intravenous contrast.  CONTRAST:  50mL OMNIPAQUE IOHEXOL 300 MG/ML SOLN, OMNIPAQUE IOHEXOL 300 MG/ML SOLN  COMPARISON:  No priors.  FINDINGS:  Lung Bases: Unremarkable.  Abdomen/Pelvis: Study is limited by considerable gross patient motion and respiratory motion. With these limitations in mind, the appearance of the liver, pancreas, spleen and bilateral adrenal glands is unremarkable. Small calcifications dependently in the gallbladder likely represents a gallstone. No current findings to suggest acute cholecystitis at this time. Numerous low-attenuation renal lesions bilaterally, largest of which are compatible with simple cysts, with the largest cyst measuring up to 6.4 cm extending exophytically off the posterior aspect of the upper pole of the right kidney. Smaller sub cm low-attenuation renal lesions are too small to definitively characterize. Retroaortic left renal vein (normal anatomical variant) incidentally noted.  There is a large right inguinal hernia containing portions of both the cecum and the terminal ileum. This extends into the right hemiscrotum, where there also appears to be a moderate to large hydrocele. Small left hydrocele also incidentally noted. Immediately proximal to this there are dilated loops distal ileum measuring up to 4.5 cm in diameter, which contained fecalized small bowel contents, indicative of stasis. Proximal small bowel does not appear dilated to suggest frank bowel obstruction at this time. These dilated loops of distal ileum demonstrate some avid mucosal enhancement, which could suggest some inflammation. Large volume of well-formed stool throughout the colon suggests a background of constipation. Trace volume of ascites in the pelvis, unusual in a male patient. This is immediately adjacent to the dilated loop distal ileum. No larger volume of ascites. No pneumoperitoneum. Prostate gland and urinary bladder are unremarkable in appearance.  Musculoskeletal: There are no aggressive appearing lytic or blastic lesions noted in the visualized portions of the skeleton. Extensive soft tissue calcifications in the  subcutaneous fat of the buttocks regions bilaterally may represent injection granulomas.  IMPRESSION: 1. Large right inguinal hernia containing a portion of the cecum and the terminal ileum, proximal to which the distal ileum appears dilated, inflamed, and contains fecalized bowel contents indicative of stasis. Although there is no overt evidence for frank bowel obstruction at this time, these changes are concerning, particularly in light of the small volume ascites in the pelvis. Surgical consultation is recommended to prevent future complications. 2. Moderate to large right-sided hydrocele and small left hydrocele incidentally noted. 3. Cholelithiasis without findings to suggest acute cholecystitis at this time. 4. Large volume of stool throughout the colon suggests a background of constipation. 5. Additional incidental findings, as above.   Electronically Signed   By: Trudie Reed M.D.   On: 11/27/2012 22:49    EKG Interpretation   None       MDM   1. Inguinal hernia      Filed Vitals:   11/27/12 1938 11/27/12 2341  BP: 157/105 154/93  Pulse: 102 78  Temp: 97.5  F (36.4 C)   TempSrc: Oral   Resp: 20 20  SpO2: 100% 100%     Willi Borowiak is a 62 y.o. male with significant mental retardation and nonreducible right inguinal hernia. This CT shows dilation proximal to loops of bowel that are herniated. Distal ileum appears dilated and inflamed. There is also a small volume of ascites. Patient is afebrile, comfortable. He is a small leukocytosis of 13.5. Surgical consult from Dr. Biagio Quint appreciated: He will take the patient to surgery.  Medications  0.9 %  sodium chloride infusion ( Intravenous New Bag/Given 11/27/12 2133)  iohexol (OMNIPAQUE) 300 MG/ML solution 50 mL (50 mLs Oral Contrast Given 11/27/12 2130)  iohexol (OMNIPAQUE) 300 MG/ML solution 100 mL (100 mLs Intravenous Contrast Given 11/27/12 2223)  morphine 4 MG/ML injection 4 mg (4 mg Intravenous Given 11/27/12 2338)     Note: Portions of this report may have been transcribed using voice recognition software. Every effort was made to ensure accuracy; however, inadvertent computerized transcription errors may be present      Wynetta Emery, PA-C 11/28/12 [phone]

## 2012-11-28 ENCOUNTER — Inpatient Hospital Stay (HOSPITAL_COMMUNITY): Payer: Medicare Other | Admitting: Certified Registered Nurse Anesthetist

## 2012-11-28 ENCOUNTER — Encounter (HOSPITAL_COMMUNITY): Payer: Medicare Other | Admitting: Certified Registered Nurse Anesthetist

## 2012-11-28 ENCOUNTER — Encounter (HOSPITAL_COMMUNITY): Payer: Self-pay | Admitting: Certified Registered Nurse Anesthetist

## 2012-11-28 ENCOUNTER — Encounter (HOSPITAL_COMMUNITY): Admission: EM | Disposition: A | Discharge status: Home / Self Care | Payer: Self-pay | Source: Home / Self Care

## 2012-11-28 DIAGNOSIS — K559 Vascular disorder of intestine, unspecified: Secondary | ICD-10-CM | POA: Diagnosis not present

## 2012-11-28 DIAGNOSIS — Z8719 Personal history of other diseases of the digestive system: Secondary | ICD-10-CM

## 2012-11-28 DIAGNOSIS — F72 Severe intellectual disabilities: Secondary | ICD-10-CM | POA: Diagnosis not present

## 2012-11-28 DIAGNOSIS — K4030 Unilateral inguinal hernia, with obstruction, without gangrene, not specified as recurrent: Secondary | ICD-10-CM

## 2012-11-28 HISTORY — PX: INGUINAL HERNIA REPAIR: SHX194

## 2012-11-28 LAB — MRSA PCR SCREENING: MRSA by PCR: NEGATIVE

## 2012-11-28 LAB — LACTIC ACID, PLASMA: Lactic Acid, Venous: 0.5 mmol/L (ref 0.5–2.2)

## 2012-11-28 SURGERY — REPAIR, HERNIA, INGUINAL, LAPAROSCOPIC
Anesthesia: General | Site: Abdomen | Wound class: Clean

## 2012-11-28 MED ORDER — GLYCOPYRROLATE 0.2 MG/ML IJ SOLN
INTRAMUSCULAR | Status: DC | PRN
  Administered 2012-11-28: 0.6 mg via INTRAVENOUS

## 2012-11-28 MED ORDER — 0.9 % SODIUM CHLORIDE (POUR BTL) OPTIME
TOPICAL | Status: DC | PRN
  Administered 2012-11-28: 1000 mL

## 2012-11-28 MED ORDER — BUPIVACAINE HCL (PF) 0.25 % IJ SOLN
INTRAMUSCULAR | Status: DC | PRN
  Administered 2012-11-28: 10 mL

## 2012-11-28 MED ORDER — NEOSTIGMINE METHYLSULFATE 1 MG/ML IJ SOLN
INTRAMUSCULAR | Status: DC | PRN
  Administered 2012-11-28: 4 mg via INTRAVENOUS

## 2012-11-28 MED ORDER — KETOROLAC TROMETHAMINE 30 MG/ML IJ SOLN: 15.0000 mg | Freq: Once | INTRAMUSCULAR | Status: DC | PRN

## 2012-11-28 MED ORDER — ONDANSETRON HCL 4 MG PO TABS: 4.0000 mg | ORAL_TABLET | Freq: Four times a day (QID) | ORAL | Status: DC | PRN

## 2012-11-28 MED ORDER — ONDANSETRON HCL 4 MG/2ML IJ SOLN: 4.0000 mg | Freq: Four times a day (QID) | INTRAMUSCULAR | Status: DC | PRN

## 2012-11-28 MED ORDER — LISINOPRIL 20 MG PO TABS
20.0000 mg | ORAL_TABLET | Freq: Every day | ORAL | Status: DC
  Administered 2012-11-28 – 2012-12-01 (×4): 20 mg via ORAL
  Filled 2012-11-28 (×4): qty 1

## 2012-11-28 MED ORDER — LIDOCAINE HCL (CARDIAC) 20 MG/ML IV SOLN
INTRAVENOUS | Status: DC | PRN
  Administered 2012-11-28: 60 mg via INTRAVENOUS

## 2012-11-28 MED ORDER — PROPOFOL 10 MG/ML IV BOLUS
INTRAVENOUS | Status: DC | PRN
  Administered 2012-11-28: 150 mg via INTRAVENOUS

## 2012-11-28 MED ORDER — AMLODIPINE BESYLATE 2.5 MG PO TABS
2.5000 mg | ORAL_TABLET | Freq: Every day | ORAL | Status: DC
  Administered 2012-11-28 – 2012-12-01 (×4): 2.5 mg via ORAL
  Filled 2012-11-28 (×4): qty 1

## 2012-11-28 MED ORDER — LIDOCAINE-EPINEPHRINE 1 %-1:100000 IJ SOLN
INTRAMUSCULAR | Status: AC
  Filled 2012-11-28: qty 1

## 2012-11-28 MED ORDER — FENTANYL CITRATE 0.05 MG/ML IJ SOLN: 25.0000 ug | INTRAMUSCULAR | Status: DC | PRN

## 2012-11-28 MED ORDER — PROMETHAZINE HCL 25 MG/ML IJ SOLN: 6.2500 mg | INTRAMUSCULAR | Status: DC | PRN

## 2012-11-28 MED ORDER — ROCURONIUM BROMIDE 100 MG/10ML IV SOLN
INTRAVENOUS | Status: DC | PRN
  Administered 2012-11-28 (×2): 10 mg via INTRAVENOUS
  Administered 2012-11-28 (×2): 20 mg via INTRAVENOUS
  Administered 2012-11-28: 50 mg via INTRAVENOUS
  Administered 2012-11-28: 10 mg via INTRAVENOUS
  Administered 2012-11-28: 20 mg via INTRAVENOUS

## 2012-11-28 MED ORDER — FENTANYL CITRATE 0.05 MG/ML IJ SOLN
INTRAMUSCULAR | Status: DC | PRN
  Administered 2012-11-28 (×2): 50 ug via INTRAVENOUS
  Administered 2012-11-28: 100 ug via INTRAVENOUS
  Administered 2012-11-28: 50 ug via INTRAVENOUS

## 2012-11-28 MED ORDER — MORPHINE SULFATE 2 MG/ML IJ SOLN: 2.0000 mg | INTRAMUSCULAR | Status: DC | PRN

## 2012-11-28 MED ORDER — SUCCINYLCHOLINE CHLORIDE 20 MG/ML IJ SOLN
INTRAMUSCULAR | Status: DC | PRN
  Administered 2012-11-28: 100 mg via INTRAVENOUS

## 2012-11-28 MED ORDER — POLYETHYLENE GLYCOL 3350 17 G PO PACK
17.0000 g | PACK | ORAL | Status: DC
  Filled 2012-11-28: qty 1

## 2012-11-28 MED ORDER — CEFAZOLIN SODIUM-DEXTROSE 2-3 GM-% IV SOLR
INTRAVENOUS | Status: AC
  Filled 2012-11-28: qty 50

## 2012-11-28 MED ORDER — HEPARIN SODIUM (PORCINE) 5000 UNIT/ML IJ SOLN
5000.0000 [IU] | Freq: Three times a day (TID) | INTRAMUSCULAR | Status: DC
  Administered 2012-11-28 – 2012-12-01 (×9): 5000 [IU] via SUBCUTANEOUS
  Filled 2012-11-28 (×11): qty 1

## 2012-11-28 MED ORDER — SODIUM CHLORIDE 0.9 % IV SOLN
INTRAVENOUS | Status: DC
  Administered 2012-11-28 – 2012-11-29 (×5): via INTRAVENOUS

## 2012-11-28 MED ORDER — EPHEDRINE SULFATE 50 MG/ML IJ SOLN
INTRAMUSCULAR | Status: DC | PRN
  Administered 2012-11-28: 10 mg via INTRAVENOUS
  Administered 2012-11-28: 15 mg via INTRAVENOUS

## 2012-11-28 MED ORDER — LIDOCAINE-EPINEPHRINE 1 %-1:100000 IJ SOLN
INTRAMUSCULAR | Status: DC | PRN
  Administered 2012-11-28: 10 mL

## 2012-11-28 MED ORDER — PHENYLEPHRINE HCL 10 MG/ML IJ SOLN
INTRAMUSCULAR | Status: DC | PRN
  Administered 2012-11-28: 40 ug via INTRAVENOUS

## 2012-11-28 MED ORDER — SODIUM CHLORIDE 0.9 % IV SOLN
INTRAVENOUS | Status: DC
  Administered 2012-11-30: 19:00:00 via INTRAVENOUS

## 2012-11-28 MED ORDER — FENTANYL CITRATE 0.05 MG/ML IJ SOLN
12.5000 ug | INTRAMUSCULAR | Status: DC | PRN
  Administered 2012-11-28 – 2012-11-29 (×5): 50 ug via INTRAVENOUS
  Administered 2012-11-29: 25 ug via INTRAVENOUS
  Administered 2012-11-29: 12.5 ug via INTRAVENOUS
  Administered 2012-11-30 – 2012-12-01 (×3): 50 ug via INTRAVENOUS
  Filled 2012-11-28 (×10): qty 2

## 2012-11-28 MED ORDER — CEFAZOLIN SODIUM-DEXTROSE 2-3 GM-% IV SOLR
2.0000 g | Freq: Once | INTRAVENOUS | Status: AC
  Administered 2012-11-28: 2 g via INTRAVENOUS

## 2012-11-28 MED ORDER — LACTATED RINGERS IV SOLN
INTRAVENOUS | Status: DC | PRN
  Administered 2012-11-28 (×4): via INTRAVENOUS

## 2012-11-28 MED ORDER — BUPIVACAINE HCL (PF) 0.25 % IJ SOLN
INTRAMUSCULAR | Status: AC
  Filled 2012-11-28: qty 30

## 2012-11-28 SURGICAL SUPPLY — 52 items
APPLIER CLIP 5 13 M/L LIGAMAX5 (MISCELLANEOUS)
BLADE SURG SZ12 CARB STEEL (BLADE) ×2 IMPLANT
CABLE HIGH FREQUENCY MONO STRZ (ELECTRODE) IMPLANT
CANISTER SUCTION 2500CC (MISCELLANEOUS) ×2 IMPLANT
CHLORAPREP W/TINT 26ML (MISCELLANEOUS) ×2 IMPLANT
CLIP APPLIE 5 13 M/L LIGAMAX5 (MISCELLANEOUS) IMPLANT
CLOTH BEACON ORANGE TIMEOUT ST (SAFETY) ×2 IMPLANT
COTTONBALL LRG STERILE PKG (GAUZE/BANDAGES/DRESSINGS) ×2 IMPLANT
DECANTER SPIKE VIAL GLASS SM (MISCELLANEOUS) ×2 IMPLANT
DERMABOND ADVANCED (GAUZE/BANDAGES/DRESSINGS) ×1
DERMABOND ADVANCED .7 DNX12 (GAUZE/BANDAGES/DRESSINGS) ×1 IMPLANT
DEVICE SECURE STRAP 25 ABSORB (INSTRUMENTS) IMPLANT
DISSECT BALLN SPACEMKR + OVL (BALLOONS)
DISSECTOR BALLN SPACEMKR + OVL (BALLOONS) IMPLANT
DRAIN CHANNEL 15F RND FF 3/16 (WOUND CARE) ×2 IMPLANT
DRAIN PENROSE 18X1/2 LTX STRL (DRAIN) ×4 IMPLANT
DRAPE LAPAROSCOPIC ABDOMINAL (DRAPES) ×2 IMPLANT
DRAPE UTILITY XL STRL (DRAPES) ×2 IMPLANT
DRSG PAD ABDOMINAL 8X10 ST (GAUZE/BANDAGES/DRESSINGS) ×2 IMPLANT
DRSG TEGADERM 4X4.75 (GAUZE/BANDAGES/DRESSINGS) ×2 IMPLANT
ELECT REM PT RETURN 9FT ADLT (ELECTROSURGICAL) ×2
ELECTRODE REM PT RTRN 9FT ADLT (ELECTROSURGICAL) ×1 IMPLANT
EVACUATOR SILICONE 100CC (DRAIN) ×2 IMPLANT
GAUZE SPONGE 2X2 8PLY STRL LF (GAUZE/BANDAGES/DRESSINGS) ×1 IMPLANT
GLOVE SURG SS PI 7.5 STRL IVOR (GLOVE) ×4 IMPLANT
GOWN STRL REIN XL XLG (GOWN DISPOSABLE) ×6 IMPLANT
KIT BASIN OR (CUSTOM PROCEDURE TRAY) ×2 IMPLANT
NS IRRIG 1000ML POUR BTL (IV SOLUTION) ×2 IMPLANT
PENCIL BUTTON HOLSTER BLD 10FT (ELECTRODE) ×2 IMPLANT
SCISSORS LAP 5X35 DISP (ENDOMECHANICALS) IMPLANT
SEALANT SURGICAL APPL DUAL CAN (MISCELLANEOUS) ×2 IMPLANT
SET IRRIG TUBING LAPAROSCOPIC (IRRIGATION / IRRIGATOR) IMPLANT
SOLUTION ANTI FOG 6CC (MISCELLANEOUS) ×2 IMPLANT
SPONGE DRAIN TRACH 4X4 STRL 2S (GAUZE/BANDAGES/DRESSINGS) ×2 IMPLANT
SPONGE GAUZE 2X2 STER 10/PKG (GAUZE/BANDAGES/DRESSINGS) ×1
SPONGE LAP 4X18 X RAY DECT (DISPOSABLE) ×10 IMPLANT
STAPLER VISISTAT 35W (STAPLE) IMPLANT
SUT ETHILON 2 0 PS N (SUTURE) ×2 IMPLANT
SUT MNCRL AB 4-0 PS2 18 (SUTURE) ×4 IMPLANT
SUT PROLENE 2 0 SH DA (SUTURE) ×4 IMPLANT
SUT VIC AB 2-0 CT2 27 (SUTURE) ×8 IMPLANT
SUT VIC AB 2-0 SH 18 (SUTURE) ×4 IMPLANT
SUT VIC AB 2-0 SH 27 (SUTURE) ×4
SUT VIC AB 2-0 SH 27X BRD (SUTURE) ×4 IMPLANT
SUT VICRYL 0 TIES 12 18 (SUTURE) ×2 IMPLANT
SUT VICRYL 0 UR6 27IN ABS (SUTURE) ×2 IMPLANT
TOWEL OR 17X26 10 PK STRL BLUE (TOWEL DISPOSABLE) ×2 IMPLANT
TOWEL OR NON WOVEN STRL DISP B (DISPOSABLE) ×2 IMPLANT
TRAY FOLEY CATH 14FRSI W/METER (CATHETERS) ×2 IMPLANT
TRAY LAP CHOLE (CUSTOM PROCEDURE TRAY) ×2 IMPLANT
TROCAR BLADELESS OPT 5 75 (ENDOMECHANICALS) ×4 IMPLANT
TUBING INSUFFLATION 10FT LAP (TUBING) ×2 IMPLANT

## 2012-11-28 NOTE — Transfer of Care (Signed)
Immediate Anesthesia Transfer of Care Note  Patient: Lance [name]  Procedure(s) Performed: Procedure(s): Diagnostic right strangulated INGUINAL HERNIA repair, and oversew on colon (N/A)  Patient Location: PACU  Anesthesia Type:General  Level of Consciousness: awake, sedated and patient cooperative  Airway & Oxygen Therapy: Patient Spontanous Breathing and Patient connected to face mask oxygen  Post-op Assessment: Report given to PACU RN and Post -op Vital signs reviewed and stable  Post vital signs: Reviewed and stable  Complications: No apparent anesthesia complications

## 2012-11-28 NOTE — Brief Op Note (Signed)
11/27/2012 - 11/28/2012  7:37 AM  PATIENT:  Lance [name]  62 y.o. male  PRE-OPERATIVE DIAGNOSIS:  incarcerated inguinal hernia  POST-OPERATIVE DIAGNOSIS:  incarcerated inquinal hernia right  PROCEDURE:  Procedure(s): Diagnostic laparoscopy, right strangulated INGUINAL HERNIA repair, and oversew on colon (N/A)  SURGEON:  Surgeon(s) and Role:    * Lodema Pilot, DO - Primary  PHYSICIAN ASSISTANT:   ASSISTANTS: none   ANESTHESIA:   general  EBL:     BLOOD ADMINISTERED:none  DRAINS: (28F) Jackson-Pratt drain(s) with closed bulb suction in the inguinal canal   LOCAL MEDICATIONS USED:  MARCAINE    and LIDOCAINE   SPECIMEN:  Source of Specimen:  hernia sac  DISPOSITION OF SPECIMEN:  PATHOLOGY  COUNTS:  YES  TOURNIQUET:  * No tourniquets in log *  DICTATION: .Other Dictation: Dictation Number dictated  PLAN OF CARE: Admit to inpatient   PATIENT DISPOSITION:  ICU - extubated and stable.   Delay start of Pharmacological VTE agent (>24hrs) due to surgical blood loss or risk of bleeding: no

## 2012-11-28 NOTE — H&P (Signed)
Reason for Consult: incarcerated inguinal hernia Referring Physician: Wynetta Emery, PA  Lance [name] is an 62 y.o. male.  HPI: I was asked to evaluate this patient for an incarcerated right inguinal hernia.  He has had this hernia for quite some time and this has been followed by his primary care physician most recently evaluated in July. Patient complained of abdominal pain and didn't eat anything for lunch and he was evaluated by one of the workers at the care facility in which he stays and he was noted to have an enlarged bulge in his right groin at the site of his prior hernia. He has been moving his bowels and has not had any fevers or chills or nausea or vomiting. The patient is difficult to communicate with because of his baseline mental status. He complains of abdominal pain all over as well as pain in his groin.  Most of the history is obtained from one of the workers at his facility.  Past Medical History  Diagnosis Date  . Hypertension   . Psychotic disorder     Past Surgical History  Procedure Laterality Date  . Colonoscopy      No family history on file.  Social History:  reports that he has never smoked. He has never used smokeless tobacco. He reports that he does not drink alcohol or use illicit drugs.  Allergies: No Known Allergies  Medications: I have reviewed the patient's current medications.  Results for orders placed during the hospital encounter of 11/27/12 (from the past 48 hour(s))  CBC WITH DIFFERENTIAL     Status: Abnormal   Collection Time    11/27/12  9:17 PM      Result Value Range   WBC 13.4 (*) 4.0 - 10.5 K/uL   RBC 4.57  4.22 - 5.81 MIL/uL   Hemoglobin 14.3  13.0 - 17.0 g/dL   HCT 16.1  09.6 - 04.5 %   MCV 89.5  78.0 - 100.0 fL   MCH 31.3  26.0 - 34.0 pg   MCHC 35.0  30.0 - 36.0 g/dL   RDW 40.9  81.1 - 91.4 %   Platelets 205  150 - 400 K/uL   Neutrophils Relative % 75  43 - 77 %   Neutro Abs 10.0 (*) 1.7 - 7.7 K/uL   Lymphocytes  Relative 18  12 - 46 %   Lymphs Abs 2.4  0.7 - 4.0 K/uL   Monocytes Relative 6  3 - 12 %   Monocytes Absolute 0.7  0.1 - 1.0 K/uL   Eosinophils Relative 1  0 - 5 %   Eosinophils Absolute 0.2  0.0 - 0.7 K/uL   Basophils Relative 0  0 - 1 %   Basophils Absolute 0.0  0.0 - 0.1 K/uL  BASIC METABOLIC PANEL     Status: Abnormal   Collection Time    11/27/12  9:17 PM      Result Value Range   Sodium 135  135 - 145 mEq/L   Potassium 3.8  3.5 - 5.1 mEq/L   Chloride 99  96 - 112 mEq/L   CO2 22  19 - 32 mEq/L   Glucose, Bld 93  70 - 99 mg/dL   BUN 19  6 - 23 mg/dL   Creatinine, Ser 7.82  0.50 - 1.35 mg/dL   Calcium 95.6  8.4 - 21.3 mg/dL   GFR calc non Af Amer 86 (*) >90 mL/min   GFR calc Af Amer >90  >90 mL/min  Comment: (NOTE)     The eGFR has been calculated using the CKD EPI equation.     This calculation has not been validated in all clinical situations.     eGFR's persistently <90 mL/min signify possible Chronic Kidney     Disease.    Ct Abdomen Pelvis W Contrast  11/27/2012   CLINICAL DATA:  Swollen left groin area that looks like a hernia.  EXAM: CT ABDOMEN AND PELVIS WITH CONTRAST  TECHNIQUE: Multidetector CT imaging of the abdomen and pelvis was performed using the standard protocol following bolus administration of intravenous contrast.  CONTRAST:  50mL OMNIPAQUE IOHEXOL 300 MG/ML SOLN, OMNIPAQUE IOHEXOL 300 MG/ML SOLN  COMPARISON:  No priors.  FINDINGS: Lung Bases: Unremarkable.  Abdomen/Pelvis: Study is limited by considerable gross patient motion and respiratory motion. With these limitations in mind, the appearance of the liver, pancreas, spleen and bilateral adrenal glands is unremarkable. Small calcifications dependently in the gallbladder likely represents a gallstone. No current findings to suggest acute cholecystitis at this time. Numerous low-attenuation renal lesions bilaterally, largest of which are compatible with simple cysts, with the largest cyst measuring up to  6.4 cm extending exophytically off the posterior aspect of the upper pole of the right kidney. Smaller sub cm low-attenuation renal lesions are too small to definitively characterize. Retroaortic left renal vein (normal anatomical variant) incidentally noted.  There is a large right inguinal hernia containing portions of both the cecum and the terminal ileum. This extends into the right hemiscrotum, where there also appears to be a moderate to large hydrocele. Small left hydrocele also incidentally noted. Immediately proximal to this there are dilated loops distal ileum measuring up to 4.5 cm in diameter, which contained fecalized small bowel contents, indicative of stasis. Proximal small bowel does not appear dilated to suggest frank bowel obstruction at this time. These dilated loops of distal ileum demonstrate some avid mucosal enhancement, which could suggest some inflammation. Large volume of well-formed stool throughout the colon suggests a background of constipation. Trace volume of ascites in the pelvis, unusual in a male patient. This is immediately adjacent to the dilated loop distal ileum. No larger volume of ascites. No pneumoperitoneum. Prostate gland and urinary bladder are unremarkable in appearance.  Musculoskeletal: There are no aggressive appearing lytic or blastic lesions noted in the visualized portions of the skeleton. Extensive soft tissue calcifications in the subcutaneous fat of the buttocks regions bilaterally may represent injection granulomas.  IMPRESSION: 1. Large right inguinal hernia containing a portion of the cecum and the terminal ileum, proximal to which the distal ileum appears dilated, inflamed, and contains fecalized bowel contents indicative of stasis. Although there is no overt evidence for frank bowel obstruction at this time, these changes are concerning, particularly in light of the small volume ascites in the pelvis. Surgical consultation is recommended to prevent future  complications. 2. Moderate to large right-sided hydrocele and small left hydrocele incidentally noted. 3. Cholelithiasis without findings to suggest acute cholecystitis at this time. 4. Large volume of stool throughout the colon suggests a background of constipation. 5. Additional incidental findings, as above.   Electronically Signed   By: Trudie Reed M.D.   On: 11/27/2012 22:49    ROS Unable to obtain secondary to mental status Blood pressure 154/93, pulse 78, temperature 97.5 F (36.4 C), temperature source Oral, resp. rate 20, SpO2 100.00%. General appearance: alert, cooperative and no distress Resp: nonlabored Cardio: normal rate, regular GI: soft, ND, no apparent tenderness, large right inguinal  hernia with scrotal extension, incarcerated Skin: Skin color, texture, turgor normal. No rashes or lesions Neurologic: Mental status: pleasant but repetitive, baseline mental retardation noted  Assessment/Plan: Incarcerated right inguinal hernia I am not certain if his hernia is causing his abdominal pain but he does have a very large and Incarcerated right inguinal hernia containing bowel.  Given his physical exam and CT scan and white blood cell count, I'm concerned for incarceration and possibly strangulation. I think that it she would benefit from urgent surgical repair. However, given his baseline mental status, I have tried to contact his brother who is his power of attorney according to the worker present but there was no answer with his brother. We will keep trying to reach him but I would recommend right inguinal hernia repair.  I spoke with his brother [name] and explained the situation.  I have recommended repair of his right inguinal hernia.  We discussed the procedure and the risks including infection, bleeding, pain, scarring, recurrence, injury to testicle or bowel and need for bowel resection.  He gave phone consent to myself and the nurse and desires to proceed with right  inguinal hernia repair.  Lodema Pilot DAVID 11/28/2012, 12:17 AM

## 2012-11-28 NOTE — Anesthesia Preprocedure Evaluation (Signed)
Anesthesia Evaluation  Patient identified by MRN, date of birth, ID band Patient awake  General Assessment Comment:MR  Reviewed: Allergy & Precautions, H&P , NPO status , Patient's Chart, lab work & pertinent test results  Airway Mallampati: II TM Distance: >3 FB Neck ROM: Full    Dental no notable dental hx.    Pulmonary neg pulmonary ROS,  breath sounds clear to auscultation  Pulmonary exam normal       Cardiovascular hypertension, Pt. on medications Rhythm:Regular Rate:Normal     Neuro/Psych negative neurological ROS  negative psych ROS   GI/Hepatic negative GI ROS, Neg liver ROS,   Endo/Other  diabetes  Renal/GU negative Renal ROS  negative genitourinary   Musculoskeletal negative musculoskeletal ROS (+)   Abdominal   Peds negative pediatric ROS (+)  Hematology negative hematology ROS (+)   Anesthesia Other Findings   Reproductive/Obstetrics negative OB ROS                           Anesthesia Physical Anesthesia Plan  ASA: II and emergent  Anesthesia Plan: General   Post-op Pain Management:    Induction: Intravenous, Rapid sequence and Cricoid pressure planned  Airway Management Planned: Oral ETT  Additional Equipment:   Intra-op Plan:   Post-operative Plan: Extubation in OR  Informed Consent: I have reviewed the patients History and Physical, chart, labs and discussed the procedure including the risks, benefits and alternatives for the proposed anesthesia with the patient or authorized representative who has indicated his/her understanding and acceptance.   Dental advisory given  Plan Discussed with: CRNA and Surgeon  Anesthesia Plan Comments:         Anesthesia Quick Evaluation

## 2012-11-28 NOTE — Anesthesia Postprocedure Evaluation (Signed)
  Anesthesia Post-op Note  Patient: Lance [name]  Procedure(s) Performed: Procedure(s) (LRB): Diagnostic right strangulated INGUINAL HERNIA repair, and oversew on colon (N/A)  Patient Location: PACU  Anesthesia Type: General  Level of Consciousness: awake and alert   Airway and Oxygen Therapy: Patient Spontanous Breathing  Post-op Pain: mild  Post-op Assessment: Post-op Vital signs reviewed, Patient's Cardiovascular Status Stable, Respiratory Function Stable, Patent Airway and No signs of Nausea or vomiting  Last Vitals:  Filed Vitals:   11/28/12 0815  BP:   Pulse: 62  Temp:   Resp: 14    Post-op Vital Signs: stable   Complications: No apparent anesthesia complications

## 2012-11-28 NOTE — ED Provider Notes (Signed)
Medical screening examination/treatment/procedure(s) were performed by non-physician practitioner and as supervising physician I was immediately available for consultation/collaboration.   Derwood Kaplan, MD 11/28/12 [phone]

## 2012-11-28 NOTE — Progress Notes (Signed)
Clinical Social Work Department BRIEF PSYCHOSOCIAL ASSESSMENT 11/28/2012  Patient:  MEAD, SLANE     Account Number:  [id number]     Admit date:  11/27/2012  Clinical Social Worker:  Candie Chroman  Date/Time:  11/28/2012 11:08 AM  Referred by:  RN  Date Referred:  11/28/2012 Referred for  Other - See comment   Other Referral:   Group Home   Interview type:  Other - See comment Other interview type:   Group Home Manager    PSYCHOSOCIAL DATA Living Status:  FACILITY Admitted from facility:  OTHER Level of care:  Group Home Primary support name:  Lanora Manis Primary support relationship to patient:   Degree of support available:   supportive    CURRENT CONCERNS Current Concerns  Post-Acute Placement   Other Concerns:    SOCIAL WORK ASSESSMENT / PLAN Pt is a 62 yr old gentleman admitted from [facility]. Pt just returned from surgery and is sleeping soundly. CSW contacted Mr. Lofton from pt's group home, to assist with d/c planning. Pt plans to return to [facility] following hospital d/c. Group Home is requesting D/C instructions /AVS upon d/c. FL2 is not required. CSW will follow to assist with d/c planning.   Assessment/plan status:  Psychosocial Support/Ongoing Assessment of Needs Other assessment/ plan:   Information/referral to community resources:   None needed at this time.    PATIENT'S/FAMILY'S RESPONSE TO PLAN OF CARE: Mr. Alanda Slim feels pt's care can be managed at the Group Home and is happy to accept pt back when stable for d/c.   Cori Razor LCSW [phone]

## 2012-11-29 ENCOUNTER — Encounter (HOSPITAL_COMMUNITY): Payer: Self-pay | Admitting: General Surgery

## 2012-11-29 LAB — CBC
MCH: 30.8 pg (ref 26.0–34.0)
MCV: 89.1 fL (ref 78.0–100.0)
Platelets: 186 10*3/uL (ref 150–400)
RBC: 3.86 MIL/uL — ABNORMAL LOW (ref 4.22–5.81)

## 2012-11-29 LAB — BASIC METABOLIC PANEL
CO2: 26 mEq/L (ref 19–32)
Calcium: 9 mg/dL (ref 8.4–10.5)
Creatinine, Ser: 0.93 mg/dL (ref 0.50–1.35)

## 2012-11-29 MED ORDER — SODIUM CHLORIDE 0.9 % IJ SOLN: 3.0000 mL | INTRAMUSCULAR | Status: DC | PRN

## 2012-11-29 MED ORDER — DIPHENHYDRAMINE HCL 50 MG/ML IJ SOLN: 12.5000 mg | Freq: Four times a day (QID) | INTRAMUSCULAR | Status: DC | PRN

## 2012-11-29 MED ORDER — SODIUM CHLORIDE 0.9 % IJ SOLN: 3.0000 mL | Freq: Two times a day (BID) | INTRAMUSCULAR | Status: DC

## 2012-11-29 MED ORDER — MAGIC MOUTHWASH
15.0000 mL | Freq: Four times a day (QID) | ORAL | Status: DC | PRN
  Filled 2012-11-29: qty 15

## 2012-11-29 MED ORDER — METOPROLOL TARTRATE 1 MG/ML IV SOLN
5.0000 mg | Freq: Four times a day (QID) | INTRAVENOUS | Status: DC | PRN
  Filled 2012-11-29: qty 5

## 2012-11-29 MED ORDER — POLYETHYLENE GLYCOL 3350 17 G PO PACK
17.0000 g | PACK | Freq: Two times a day (BID) | ORAL | Status: DC
  Administered 2012-11-29 – 2012-12-01 (×4): 17 g via ORAL
  Filled 2012-11-29 (×5): qty 1

## 2012-11-29 MED ORDER — LACTATED RINGERS IV BOLUS (SEPSIS): 1000.0000 mL | Freq: Three times a day (TID) | INTRAVENOUS | Status: DC | PRN

## 2012-11-29 MED ORDER — CHLORPROMAZINE HCL 50 MG PO TABS: 250.0000 mg | ORAL_TABLET | Freq: Every day | ORAL | Status: DC

## 2012-11-29 MED ORDER — PROMETHAZINE HCL 25 MG/ML IJ SOLN: 12.5000 mg | Freq: Four times a day (QID) | INTRAMUSCULAR | Status: DC | PRN

## 2012-11-29 MED ORDER — MAGNESIUM HYDROXIDE 400 MG/5ML PO SUSP: 30.0000 mL | Freq: Two times a day (BID) | ORAL | Status: DC | PRN

## 2012-11-29 MED ORDER — ALUM & MAG HYDROXIDE-SIMETH 200-200-20 MG/5ML PO SUSP: 30.0000 mL | Freq: Four times a day (QID) | ORAL | Status: DC | PRN

## 2012-11-29 MED ORDER — SACCHAROMYCES BOULARDII 250 MG PO CAPS
250.0000 mg | ORAL_CAPSULE | Freq: Two times a day (BID) | ORAL | Status: DC
  Administered 2012-11-29 – 2012-12-01 (×4): 250 mg via ORAL
  Filled 2012-11-29 (×5): qty 1

## 2012-11-29 MED ORDER — BISACODYL 10 MG RE SUPP: 10.0000 mg | Freq: Two times a day (BID) | RECTAL | Status: DC | PRN

## 2012-11-29 MED ORDER — BISACODYL 10 MG RE SUPP
10.0000 mg | Freq: Every day | RECTAL | Status: DC
  Administered 2012-11-29 – 2012-12-01 (×3): 10 mg via RECTAL
  Filled 2012-11-29 (×3): qty 1

## 2012-11-29 MED ORDER — CHLORPROMAZINE HCL 25 MG PO TABS
25.0000 mg | ORAL_TABLET | Freq: Every day | ORAL | Status: DC
  Administered 2012-11-29 – 2012-11-30 (×2): 25 mg via ORAL
  Filled 2012-11-29 (×3): qty 1

## 2012-11-29 MED ORDER — SODIUM CHLORIDE 0.9 % IV SOLN: 250.0000 mL | INTRAVENOUS | Status: DC | PRN

## 2012-11-29 MED ORDER — LORAZEPAM 0.5 MG PO TABS
0.5000 mg | ORAL_TABLET | Freq: Two times a day (BID) | ORAL | Status: DC
  Administered 2012-11-30 – 2012-12-01 (×3): 0.5 mg via ORAL
  Filled 2012-11-29 (×3): qty 1

## 2012-11-29 MED ORDER — LIP MEDEX EX OINT
1.0000 "application " | TOPICAL_OINTMENT | Freq: Two times a day (BID) | CUTANEOUS | Status: DC
  Administered 2012-11-29 – 2012-12-01 (×4): 1 via TOPICAL
  Filled 2012-11-29 (×2): qty 7

## 2012-11-29 NOTE — Progress Notes (Signed)
Tolerating some by mouth.  Agree with removing Foley.  I&O catheter when necessary.  Get back on neurological/sedating medications.  Try and mobilize to get this into recovery.  History of chronic constipation. We'll try bowel regimen. Will try to advances diet gradually.

## 2012-11-29 NOTE — Op Note (Signed)
Lance [name], Lance [name] NO.:  [id number]  MEDICAL RECORD NO.:  [id number]  LOCATION:  1532                         FACILITY:  [facility]  PHYSICIAN:  Lodema Pilot, MD       DATE OF BIRTH:  06-Feb-1950  DATE OF PROCEDURE:  11/28/2012 DATE OF DISCHARGE:                              OPERATIVE REPORT   PROCEDURE:  Diagnostic laparoscopy and laparoscopic over-sewing of the colon with open repair of strangulated right inguinal hernia.  PREOPERATIVE DIAGNOSIS:  Incarcerated right inguinal hernia.  POSTOPERATIVE DIAGNOSIS:  Strangulated right inguinal hernia.  SURGEON:  Lodema Pilot, MD  ASSISTANT:  None.  ANESTHESIA:  General endotracheal tube anesthesia.  FLUIDS:  3900 mL of crystalloid.  ESTIMATED BLOOD LOSS:  100 mL.  DRAINS:  A 15-French Blake drain placed in the right inguinal canal.  SPECIMENS:  Hernia sac.  COMPLICATIONS:  None apparent.  FINDINGS:  Strangulated right inguinal hernia containing cecum and terminal ileum, small ring of ischemia at the internal ring, and a very small patch of ischemia exists on the cecum or pericolonic fat, which was oversewn with interrupted Lembert 2-0 Vicryl sutures.  INDICATIONS FOR PROCEDURE:  Lance [name] is a 62 year old male with a longstanding history of right inguinal hernia that has been increasing in size.  He complained of abdominal pain for 1 day and poor appetite and it was noted by one of his care workers that he had increase in size of his known right inguinal hernia.  He was brought to the emergency room for evaluation.  CT scan confirmed the presence of cecum in small bowel and possible bowel obstruction from the hernia.  He also had some fluid and some stranding in the area concerning for incarceration and strangulation.  On exam, I could not reduce this large defect.  OPERATIVE DETAILS:  Lance [name] was seen and evaluated in the emergency room and given his baseline mental status, I was unable to  obtain consent from the patient and so I called his brother Lance [name], who I was told from his caregivers is his power of attorney and consenting authority.  Phone consent was obtained after discussing the procedure and the risks, and he was taken to the operating room, placed on table in supine position.  General anesthesia was obtained and Foley catheter was placed.  After he was asleep under general anesthesia, I again attempted to reduce the hernia, and I had no success in reducing the size of the right inguinal hernia.  This appeared to have contents all the way down into the scrotum and to the testicle with shift of everything towards the left side.  His groin, scrotum, and abdomen were prepped and draped in a standard surgical fashion and procedure time-out was performed with all operative team members to confirm proper patient and procedure.  The supraumbilical midline incision was made in the skin and dissection carried down to the subcutaneous tissue using blunt dissection.  The fascia was elevated and sharply incised.  The peritoneum entered.  A 12 mm balloon port was placed at the umbilicus. The pneumoperitoneum was obtained.  Laparoscope was introduced and there was no evidence of bleeding or bowel injury upon entry.  Two 5 mm  trocars were placed both on the right lateral abdomen and the left lateral abdomen and allowed me to investigate the bowel.  The bowel was distended and there was fluid in the abdomen and the pelvis, concerning for a bowel obstruction.  The left groin appeared normal without any obvious hernia on the right.  He had what appeared to be an indirect inguinal hernia containing colon and small intestine which were coursing up through the defect.  I tried to reduce the small intestine laparoscopically, but I was unable to retrieve the bowel from the defects laparoscopically and also again manually trying to reduce this externally.  At this point, I made a large  right groin incision and dissected down to the subcutaneous tissue using Bovie electrocautery. It was difficult to identify any of the usual planes and fascial planes. Because of all the distortion from the large hernia, he did not have the usual white external oblique fascia.  This was attenuated.  I was unable to visualize the ilioinguinal nerve and the several usual structures as well.  He had what looked to be colon protruding through the internal ring.  I tried to dissect the sac free at the testicle.  I was able to dissect circumferentially around the hernia sac and I could see the distal end of the sac and I started to dissect through the cremaster fibers and entered what I thought was the hernia sac and initially we entered the hydrocele which the patient had a known hydrocele and evacuated the fluid.  We dissected through the distal aspect of the hernia sac and then after opening the distal sac, I carried the dissection proximally towards the internal ring.  At this point, I was able to reduce the contents of the hernia after being able to place my finger into the defects from external and I was able to palpate the vas deferens and the cord structures.  I tried to separate the cord structures from the hernia sac, although the hernia sac was very thick from this chronic hernia and all of the vessels along the sac appeared enlarged and were concerning for the vessels to the testicle.  I tried to preserve as much of the cremaster fibers and the vessels which appeared to be going towards the testicle.  After the bowel contents were reduced, I pulled up on the hernia sac and I was able to free up what appeared to be a black ring at the internal ring along the sac concerning for focal ischemia from compression.  I performed high ligation of the sac, taking care to avoid injury to the underlying contents.  Again the sac was very thick and inflamed and it was removed and sent to  Pathology.  I then closed the sac with a 2-0 Vicryl running locking suture and reduced the sac back into the abdomen.  I decided not to place a mesh and approximated the fascia around the internal ring with interrupted 2-0 Prolene sutures approximating the abdominal wall fascia and muscle to the inguinal ligament.  This was done lateral as well as medial to the cord.  It actually appeared to close the hernia defect, although again the usual fascial structures such as the lacunar ligament and the pubic fascia were not as clearly visualized as usual, although I could see the inguinal ligament and I used this port for my repair.  I then irrigated the wound and left a 15-French Blake drain in the inguinal canal coursing down the inguinal canal towards  the right testicle and this was exited through a separate stab incision in the right lower quadrant what appeared to be external oblique fascia was approximated over the drain with 2-0 Vicryl suture, taking care to avoid suturing the drains in the wound.  Then another 2-0 Vicryl suture was used to approximate the what looked like Scarpa's fascia.  I washed the wound with sterile saline solution and the drain was sutured in place with a nylon drain stitch.  The skin edges were then approximated with skin staples.  I replaced the laparoscope into the abdomen and inspected the bowel which had been reduced.  There are areas of bruising and what appeared to be areas concerning for ischemia on the cecum had mostly improved and he had some erythema on the terminal ileum and the cecum. There is only one small 5 mm area which was darker, purple, and concerning for possible ischemia.  This was on the lateral border of the cecum and not clearly on the cecum and could have been on the pericolonic fat and tissues of the peritoneum, but decided to oversew this area with 2-0 Vicryl Lembert sutures laparoscopically to imbricate this area concerning for possible  ischemia.  I could see the hernia sac which had been sewn externally and reduced back in the abdomen and after this area was oversewn, felt that this would be adequate repair.  I then closed the umbilical fascia with interrupted 0 Vicryl sutures in open fashion and the mesh reinsufflated with carbon dioxide gas and the abdominal wall closure was noted to be adequate without any evidence of bowel injury.  The final trocars removed and the skin edges were approximated with skin staples.  Foley catheter was left in place, as it has, I think that the bladder was actually approaching the hernia as well, although I was not concerned for any bladder injury during the hernia repair.  The abdomen was washed and dried and sterile dressings applied.  All sponge, needle, and instrument counts were correct at end of the case.  The patient tolerated the procedure well and was stable and ready for transfer to the recovery room.          ______________________________ Lodema Pilot, MD     BL/MEDQ  D:  11/28/2012  T:  11/29/2012  Job:  [id number]

## 2012-11-29 NOTE — Progress Notes (Signed)
1 Day Post-Op  Subjective: Pt feels good, but c/o pain at incision.  No N/V.  Unsure of flatus, no BM yet.  NPO currently but has BS.  Ambulated some, wants to get up more to walk.  Urinating through foley.   Objective: Vital signs in last 24 hours: Temp:  [97.6 F (36.4 C)-98.3 F (36.8 C)] 97.6 F (36.4 C) (11/05 0934) Pulse Rate:  [82-100] 100 (11/05 0934) Resp:  [14-19] 18 (11/05 0610) BP: (111-152)/(68-99) 135/84 mmHg (11/05 0934) SpO2:  [97 %-100 %] 100 % (11/05 0934) Last BM Date: 11/27/12  Intake/Output from previous day: 11/04 0701 - 11/05 0700 In: 3693.8 [I.V.:3693.8] Out: 4070 [Urine:3850; Drains:120; Blood:100] Intake/Output this shift: Total I/O In: -  Out: 610 [Urine:600; Drains:10]  PE: Gen:  Alert, NAD, pleasant Abd: Soft, mild tenderness, ND, +BS, no HSM, incisions C/D/I, drain with minimal serosanguinous drainage   Lab Results:   Recent Labs  11/27/12 2117 11/29/12 0410  WBC 13.4* 12.4*  HGB 14.3 11.9*  HCT 40.9 34.4*  PLT 205 186   BMET  Recent Labs  11/27/12 2117 11/29/12 0410  NA 135 136  K 3.8 3.5  CL 99 101  CO2 22 26  GLUCOSE 93 86  BUN 19 12  CREATININE 0.99 0.93  CALCIUM 10.0 9.0   PT/INR No results found for this basename: LABPROT, INR,  in the last 72 hours CMP     Component Value Date/Time   NA 136 11/29/2012 0410   K 3.5 11/29/2012 0410   CL 101 11/29/2012 0410   CO2 26 11/29/2012 0410   GLUCOSE 86 11/29/2012 0410   BUN 12 11/29/2012 0410   CREATININE 0.93 11/29/2012 0410   CALCIUM 9.0 11/29/2012 0410   PROT 6.9 05/27/2008 2252   ALBUMIN 4.1 05/27/2008 2252   AST 17 05/27/2008 2252   ALT 14 05/27/2008 2252   ALKPHOS 83 05/27/2008 2252   BILITOT 0.6 05/27/2008 2252   GFRNONAA 88* 11/29/2012 0410   GFRAA >90 11/29/2012 0410   Lipase  No results found for this basename: lipase       Studies/Results: Ct Abdomen Pelvis W Contrast  11/27/2012   CLINICAL DATA:  Swollen left groin area that looks like a hernia.  EXAM: CT ABDOMEN  AND PELVIS WITH CONTRAST  TECHNIQUE: Multidetector CT imaging of the abdomen and pelvis was performed using the standard protocol following bolus administration of intravenous contrast.  CONTRAST:  50mL OMNIPAQUE IOHEXOL 300 MG/ML SOLN, OMNIPAQUE IOHEXOL 300 MG/ML SOLN  COMPARISON:  No priors.  FINDINGS: Lung Bases: Unremarkable.  Abdomen/Pelvis: Study is limited by considerable gross patient motion and respiratory motion. With these limitations in mind, the appearance of the liver, pancreas, spleen and bilateral adrenal glands is unremarkable. Small calcifications dependently in the gallbladder likely represents a gallstone. No current findings to suggest acute cholecystitis at this time. Numerous low-attenuation renal lesions bilaterally, largest of which are compatible with simple cysts, with the largest cyst measuring up to 6.4 cm extending exophytically off the posterior aspect of the upper pole of the right kidney. Smaller sub cm low-attenuation renal lesions are too small to definitively characterize. Retroaortic left renal vein (normal anatomical variant) incidentally noted.  There is a large right inguinal hernia containing portions of both the cecum and the terminal ileum. This extends into the right hemiscrotum, where there also appears to be a moderate to large hydrocele. Small left hydrocele also incidentally noted. Immediately proximal to this there are dilated loops distal ileum measuring up  to 4.5 cm in diameter, which contained fecalized small bowel contents, indicative of stasis. Proximal small bowel does not appear dilated to suggest frank bowel obstruction at this time. These dilated loops of distal ileum demonstrate some avid mucosal enhancement, which could suggest some inflammation. Large volume of well-formed stool throughout the colon suggests a background of constipation. Trace volume of ascites in the pelvis, unusual in a male patient. This is immediately adjacent to the dilated loop  distal ileum. No larger volume of ascites. No pneumoperitoneum. Prostate gland and urinary bladder are unremarkable in appearance.  Musculoskeletal: There are no aggressive appearing lytic or blastic lesions noted in the visualized portions of the skeleton. Extensive soft tissue calcifications in the subcutaneous fat of the buttocks regions bilaterally may represent injection granulomas.  IMPRESSION: 1. Large right inguinal hernia containing a portion of the cecum and the terminal ileum, proximal to which the distal ileum appears dilated, inflamed, and contains fecalized bowel contents indicative of stasis. Although there is no overt evidence for frank bowel obstruction at this time, these changes are concerning, particularly in light of the small volume ascites in the pelvis. Surgical consultation is recommended to prevent future complications. 2. Moderate to large right-sided hydrocele and small left hydrocele incidentally noted. 3. Cholelithiasis without findings to suggest acute cholecystitis at this time. 4. Large volume of stool throughout the colon suggests a background of constipation. 5. Additional incidental findings, as above.   Electronically Signed   By: Trudie Reed M.D.   On: 11/27/2012 22:49    Anti-infectives: Anti-infectives   Start     Dose/Rate Route Frequency Ordered Stop   11/28/12 0200  [MAR Hold]  ceFAZolin (ANCEF) IVPB 2 g/50 mL premix     (On MAR Hold since 11/28/12 0353)   2 g 100 mL/hr over 30 Minutes Intravenous  Once 11/28/12 0151 11/28/12 0356       Assessment/Plan Strangulated RIH POD #1 s/p diagnostic lap with laparoscopic over-sewing of the colon with open repair RIH Deconditioning Mental retardation 1.  Advance to clear liquids now that bowel function is returning 2.  IVF, pain control 3.  D/c foley 4.  Ambulate and IS 5.  SCD's and heparin 6.  Order PT/OT given new deconditioning to see if he's safe to return to group home    LOS: 2 days    [place],  [facility] 11/29/2012, 11:20 AM Pager: [phone]

## 2012-11-30 LAB — BASIC METABOLIC PANEL
Calcium: 8.7 mg/dL (ref 8.4–10.5)
Chloride: 104 mEq/L (ref 96–112)
Creatinine, Ser: 0.85 mg/dL (ref 0.50–1.35)
GFR calc Af Amer: >90 mL/min (ref >90)
GFR calc non Af Amer: >90 mL/min (ref >90)
Glucose, Bld: 88 mg/dL (ref 70–99)

## 2012-11-30 LAB — CBC
HCT: 31.3 % — ABNORMAL LOW (ref 39.0–52.0)
Hemoglobin: 10.7 g/dL — ABNORMAL LOW (ref 13.0–17.0)
MCH: 30.3 pg (ref 26.0–34.0)
MCHC: 34.2 g/dL (ref 30.0–36.0)
MCV: 88.7 fL (ref 78.0–100.0)
Platelets: 166 10*3/uL (ref 150–400)
RDW: 12.5 % (ref 11.5–15.5)
WBC: 8.7 10*3/uL (ref 4.0–10.5)

## 2012-11-30 MED ORDER — NYSTATIN 100000 UNIT/GM EX POWD
Freq: Two times a day (BID) | CUTANEOUS | Status: DC
  Administered 2012-11-30 – 2012-12-01 (×2): via TOPICAL
  Filled 2012-11-30: qty 15

## 2012-11-30 NOTE — Evaluation (Addendum)
Physical Therapy Evaluation Patient Details Name: Lance [name] MRN: [id number] DOB: December 25, 1950 Today's Date: 11/30/2012 Time: 0865-7846 PT Time Calculation (min): 24 min  PT Assessment / Plan / Recommendation History of Present Illness  62 yo male s/p lap hernia repair 11/5. Hx of mental retardation, TBI. From group home  Clinical Impression  On eval, pt required Min guard assist for ambulation-able to ambulate ~500 feet. Tolerated activity well. NO LOB during session. Do not anticipate pt will need and follow up PT at discharge. Recommend daily mobility with nursing.     PT Assessment  Patient needs continued PT services    Follow Up Recommendations  No PT follow up; 24 hour supervision/assist    Does the patient have the potential to tolerate intense rehabilitation      Barriers to Discharge        Equipment Recommendations  None recommended by PT    Recommendations for Other Services OT consult   Frequency Min 2X/week    Precautions / Restrictions Precautions Precautions: None Restrictions Weight Bearing Restrictions: No   Pertinent Vitals/Pain Pt denied pain      Mobility  Bed Mobility Bed Mobility: Supine to Sit Supine to Sit: HOB elevated;6: Modified independent (Device/Increase time) Transfers Transfers: Sit to Stand;Stand to Sit Sit to Stand: 5: Supervision;From bed;From toilet Stand to Sit: 5: Supervision;To toilet;To chair/3-in-1 Ambulation/Gait Ambulation/Gait Assistance: 4: Min guard Ambulation Distance (Feet): 500 Feet Assistive device: None Gait Pattern: Step-through pattern    Exercises     PT Diagnosis: Difficulty walking;Generalized weakness  PT Problem List: Decreased mobility PT Treatment Interventions: DME instruction;Gait training;Functional mobility training;Therapeutic activities;Therapeutic exercise;Patient/family education     PT Goals(Current goals can be found in the care plan section) Acute Rehab PT Goals Patient Stated  Goal: did not state PT Goal Formulation: Patient unable to participate in goal setting Time For Goal Achievement: 12/07/12 Potential to Achieve Goals: Good  Visit Information  Last PT Received On: 11/30/12 Assistance Needed: +1 History of Present Illness: 62 yo male s/p lap hernia repair 11/5. Hx of mental retardation, TBI. From group home       Prior Functioning  Home Living Family/patient expects to be discharged to:: Group home Additional Comments: has a bath tub, walked with a walker Prior Function Level of Independence: Independent Communication Communication: Other (comment);No difficulties    Cognition  Cognition Arousal/Alertness: Awake/alert Behavior During Therapy: WFL for tasks assessed/performed Overall Cognitive Status: History of cognitive impairments - at baseline    Extremity/Trunk Assessment Upper Extremity Assessment Upper Extremity Assessment: Overall WFL for tasks assessed Lower Extremity Assessment Lower Extremity Assessment: Overall WFL for tasks assessed Cervical / Trunk Assessment Cervical / Trunk Assessment: Normal   Balance    End of Session PT - End of Session Activity Tolerance: Patient tolerated treatment well Patient left: in chair;with call bell/phone within reach  GP     Rebeca Alert, MPT Pager: [phone]

## 2012-11-30 NOTE — Evaluation (Signed)
Occupational Therapy Evaluation Patient Details Name: Lance [name] MRN: [id number] DOB: 1950-12-11 Today's Date: 11/30/2012 Time: 6063-0160 OT Time Calculation (min): 30 min  OT Assessment / Plan / Recommendation History of present illness repair of strangulated right inguinal hernia   Clinical Impression    Pt presents to OT with decreased I with ADL activity . Pt lives at group home in which he does have some A.  Will benefit from skilled OT to increase I with ADL activity and return to PLOF  OT Assessment  Patient needs continued OT Services    Follow Up Recommendations  Home health OT;Other (comment) (at group home)       Equipment Recommendations  None recommended by OT       Frequency  Min 2X/week    Precautions / Restrictions Precautions Precautions: None Restrictions Weight Bearing Restrictions: No       ADL  Grooming: Minimal assistance Where Assessed - Grooming: Unsupported sitting Upper Body Bathing: Minimal assistance Where Assessed - Upper Body Bathing: Unsupported sitting Lower Body Bathing: Moderate assistance Where Assessed - Lower Body Bathing: Supported sit to stand Upper Body Dressing: Minimal assistance Where Assessed - Upper Body Dressing: Unsupported sitting Lower Body Dressing: Moderate assistance Where Assessed - Lower Body Dressing: Supported sit to Statistician and Hygiene: Moderate assistance Where Assessed - Toileting Clothing Manipulation and Hygiene: Standing    OT Diagnosis: Generalized weakness  OT Problem List: Decreased strength OT Treatment Interventions: Self-care/ADL training   OT Goals(Current goals can be found in the care plan section) Acute Rehab OT Goals Patient Stated Goal: did not state OT Goal Formulation: With patient Time For Goal Achievement: 12/14/12 ADL Goals Pt Will Perform Grooming: with supervision;standing Pt Will Perform Upper Body Dressing: with supervision;sitting Pt Will  Perform Lower Body Dressing: with supervision;sit to/from stand Pt Will Transfer to Toilet: with supervision;regular height toilet Pt Will Perform Toileting - Clothing Manipulation and hygiene: with supervision;sit to/from stand Pt Will Perform Tub/Shower Transfer: with min assist  Visit Information  Last OT Received On: 11/30/12 Assistance Needed: +1 History of Present Illness: repair of strangulated right inguinal hernia       Prior Functioning     Home Living Family/patient expects to be discharged to:: Group home Additional Comments: has a bath tub, walked with a walker Prior Function Level of Independence: Independent (at group home) Communication Communication: Other (comment);No difficulties         Vision/Perception Vision - History Patient Visual Report: No change from baseline   Cognition  Cognition Arousal/Alertness: Awake/alert Behavior During Therapy: WFL for tasks assessed/performed Overall Cognitive Status: History of cognitive impairments - at baseline    Extremity/Trunk Assessment Upper Extremity Assessment Upper Extremity Assessment: Overall WFL for tasks assessed     Mobility Bed Mobility Bed Mobility: Supine to Sit Supine to Sit: 4: Min assist;HOB flat Transfers Transfers: Sit to Stand;Stand to Sit Sit to Stand: 4: Min guard;With armrests;From chair/3-in-1 Stand to Sit: 4: Min guard;With armrests;With upper extremity assist           End of Session OT - End of Session Activity Tolerance: Patient tolerated treatment well Patient left: in chair;with call bell/phone within reach  GO     [facility], [place] 11/30/2012, 10:57 AM

## 2012-11-30 NOTE — Progress Notes (Signed)
2 Days Post-Op  Subjective: Pt tolerating D1 diet.  No N/V, minimal abdominal pain.  No recorded BM's but pt thinks he's passed gas and BM's.  Mobilizing with OT.  PT to eval.    Objective: Vital signs in last 24 hours: Temp:  [98 F (36.7 C)-98.9 F (37.2 C)] 98 F (36.7 C) (11/06 0559) Pulse Rate:  [89-110] 103 (11/06 0559) Resp:  [18] 18 (11/06 0559) BP: (119-125)/(79-86) 121/86 mmHg (11/06 0559) SpO2:  [97 %-100 %] 97 % (11/06 0559) Last BM Date: 11/27/12  Intake/Output from previous day: 11/05 0701 - 11/06 0700 In: 2340 [P.O.:840; I.V.:1500] Out: 1590 [Urine:1570; Drains:20] Intake/Output this shift: Total I/O In: -  Out: 25 [Drains:25]  PE: Gen:  Alert, NAD, pleasant Abd: Soft, NT/ND, +BS, no HSM, incisions C/D/I with dry sanguinous drainage on dressing, drain with minimal serosanguinous drainage   Lab Results:   Recent Labs  11/29/12 0410 11/30/12 0359  WBC 12.4* 8.7  HGB 11.9* 10.7*  HCT 34.4* 31.3*  PLT 186 166   BMET  Recent Labs  11/29/12 0410 11/30/12 0359  NA 136 138  K 3.5 3.4*  CL 101 104  CO2 26 26  GLUCOSE 86 88  BUN 12 11  CREATININE 0.93 0.85  CALCIUM 9.0 8.7   PT/INR No results found for this basename: LABPROT, INR,  in the last 72 hours CMP     Component Value Date/Time   NA 138 11/30/2012 0359   K 3.4* 11/30/2012 0359   CL 104 11/30/2012 0359   CO2 26 11/30/2012 0359   GLUCOSE 88 11/30/2012 0359   BUN 11 11/30/2012 0359   CREATININE 0.85 11/30/2012 0359   CALCIUM 8.7 11/30/2012 0359   PROT 6.9 05/27/2008 2252   ALBUMIN 4.1 05/27/2008 2252   AST 17 05/27/2008 2252   ALT 14 05/27/2008 2252   ALKPHOS 83 05/27/2008 2252   BILITOT 0.6 05/27/2008 2252   GFRNONAA >90 11/30/2012 0359   GFRAA >90 11/30/2012 0359   Lipase  No results found for this basename: lipase       Studies/Results: No results found.  Anti-infectives: Anti-infectives   Start     Dose/Rate Route Frequency Ordered Stop   11/28/12 0200  [MAR Hold]  ceFAZolin (ANCEF)  IVPB 2 g/50 mL premix     (On MAR Hold since 11/28/12 0353)   2 g 100 mL/hr over 30 Minutes Intravenous  Once 11/28/12 0151 11/28/12 0356       Assessment/Plan Strangulated RIH POD #2 s/p diagnostic lap with laparoscopic over-sewing of the colon with open repair RIH  Deconditioning  Mental retardation  Hypokalemia - mild at 3.4, should resolve with advanced diet 1. Advanced to D1 diet and tolerated this well, advance to d3 at dinner 2. KVO IVF, pain control  3. Order PT/OT recommend HH OT, PT pending eval 4. Ambulate and IS  5. SCD's and heparin  6. JP drain only putting out 20-67mL per day for the last 2 days.  Likely d/c upon discharge. 7. D/c tomorrow?    LOS: 3 days    DORT, Collins Dimaria 11/30/2012, 11:01 AM Pager: [phone]

## 2012-11-30 NOTE — Progress Notes (Signed)
CSW assisting with d/c planning. PN reviewed. Mr. Lance [name] ( from [facility] ) has been contacted and is aware of pt's possible d/c tomorrow. He has been visiting pt and feels comfortable with pt's plans to return to group home. CSW will contact Mr. Lofton in the am to confirm d/c plan.  Cori Razor LCSW [phone]

## 2012-11-30 NOTE — Progress Notes (Signed)
Eating solid food. In good sports.  Echoes every statement I make but seems calm and happy.  He pulled the dressing on the right lower quadrant off. Incision okay. No erythema. Drainage output seems minimal.  Anticipate removing draining morning. Probably okay to go back to his facility tomorrow if continues to improve.

## 2012-12-01 ENCOUNTER — Telehealth (INDEPENDENT_AMBULATORY_CARE_PROVIDER_SITE_OTHER): Payer: Self-pay

## 2012-12-01 MED ORDER — TRAMADOL HCL 50 MG PO TABS: 50.0000 mg | ORAL_TABLET | Freq: Four times a day (QID) | ORAL | Status: DC | PRN

## 2012-12-01 MED ORDER — TRAMADOL HCL 50 MG PO TABS: 50.0000 mg | ORAL_TABLET | Freq: Four times a day (QID) | ORAL | Status: AC | PRN

## 2012-12-01 MED ORDER — NYSTATIN 100000 UNIT/GM EX POWD: CUTANEOUS | Status: DC

## 2012-12-01 NOTE — Telephone Encounter (Signed)
Message copied by Maryan Puls on Fri Dec 01, 2012  5:02 PM ------      Message from: Wilder Glade      Created: Fri Dec 01, 2012  9:31 AM                   ----- Message -----         From: Aris Georgia, PA-C         Sent: 12/01/2012   9:27 AM           To: [facility] Clinical Pool            Please schedule appt with Dr. Biagio Quint next Friday 11/14 for staple removal, and wound check, and then in 2-3 weeks with Dr. Biagio Quint for PO visit            S/p Diagnostic laparoscopy and laparoscopic over-sewing of the      colon with open repair of strangulated right inguinal hernia. ------

## 2012-12-01 NOTE — Care Management Note (Signed)
    Page 1 of 1   12/01/2012     10:47:20 AM   CARE MANAGEMENT NOTE 12/01/2012  Patient:  Lance [name], Lance [name]   Account Number:  [id number]  Date Initiated:  12/01/2012  Documentation initiated by:  Lorenda Ishihara  Subjective/Objective Assessment:   62 yo male admitted s/p inginal hernia repairs. PTA lived in Group Home, pmh of mental retardation.     Action/Plan:   Return to Group Home when stable   Anticipated DC Date:  12/01/2012   Anticipated DC Plan:  GROUP HOME  In-house referral  Clinical Social Worker      DC Planning Services  CM consult      Choice offered to / List presented to:             Status of service:  Completed, signed off Medicare Important Message given?   (If response is "NO", the following Medicare IM given date fields will be blank) Date Medicare IM given:   Date Additional Medicare IM given:    Discharge Disposition:  GROUP HOME  Per UR Regulation:  Reviewed for med. necessity/level of care/duration of stay  If discussed at Long Length of Stay Meetings, dates discussed:    Comments:

## 2012-12-01 NOTE — Progress Notes (Signed)
Patient dressed and ready for return to [facility]. Discharge instructions given to [facility]. To call MD office if have any issues post-op. Patient verbal and very pleasant without any complaints expressed. Ambulatory and toilets and feeds self.

## 2012-12-01 NOTE — Telephone Encounter (Signed)
Called and spoke to Jennings with appointment for staple removal scheduled for 12/08/12 @ 10:40 am and post op appointment Scheduled for 12/15/12 @ 10:15 am w/Dr. Biagio Quint.

## 2012-12-01 NOTE — Discharge Summary (Signed)
ATTENDING ADDENDUM:  I personally reviewed patient's record, examined the patient, and formulated the following assessment and plan:  Doing well.  Ok for d/c

## 2012-12-01 NOTE — Consult Note (Signed)
Spoke to patient and he declined walker.

## 2012-12-01 NOTE — Progress Notes (Signed)
Pt to be d/c to [facility] today. [facility] home.   Cori Razor LCSW [phone]

## 2012-12-01 NOTE — Discharge Summary (Signed)
Physician Discharge Summary  Patient ID: Lance [name] MRN: [id number] DOB/AGE: Aug 14, 1950 62 y.o.  Admit date: 11/27/2012 Discharge date: 12/01/2012  Admitting Diagnosis: Incarcerated RIH Leukocytosis   Discharge Diagnosis Patient Active Problem List   Diagnosis Date Noted  . S/P right inguinal hernia repair 11/28/2012  . Right inguinal hernia 11/27/2012  . LIVER FUNCTION TESTS, ABNORMAL, HX OF 05/27/2008  . OBESITY 11/27/2007  . BILIRUBINURIA 11/27/2007  . CONSTIPATION, CHRONIC 10/08/2007  . DIABETES MELLITUS, CONTROLLED 05/29/2007  . MENTAL RETARDATION 05/29/2007  . TRAUMATIC BRAIN INJURY 05/29/2007  . HYPERTENSION, BENIGN ESSENTIAL 05/29/2007  . HEMATOCHEZIA, HX OF 01/16/2007  . CLOSED FRACTURE OF UNSPECIFIED PART OF FIBULA 10/13/2006    Consultants None  Imaging: No results found.  Procedures Dr. Biagio Quint (11/28/12) - Diagnostic laparoscopy and laparoscopic over-sewing of the colon with open repair of strangulated right inguinal hernia.   Hospital Course:  62 y/o MR male who presented to [facility] with an incarcerated right inguinal hernia. He has had this hernia for quite some time and this has been followed by his PCP most recently evaluated in July. Patient complained of abdominal pain and didn't eat anything for lunch and he was evaluated by one of the workers at the care facility in which he stays and he was noted to have an enlarged bulge in his right groin at the site of his prior hernia. He has been moving his bowels and has not had any fevers or chills or nausea or vomiting. The patient is difficult to communicate with because of his baseline MR and mimicking behavior. He complaind of abdominal pain all over as well as pain in his groin.   Patient was admitted and underwent procedure listed above.  Tolerated procedure well and was transferred to the floor.  Diet was advanced as tolerated.  On POD #3, the patient was voiding well, tolerating diet, ambulating well,  pain well controlled, vital signs stable, incisions c/d/i and felt stable for discharge home to the group home where he lives.  Patient will follow up in our office next week for staple removal and in 2-3 weeks for post op check.  His group home workers are encouraged to call with questions or concerns.  He will need a dry dressing over top of the right groin incision and to apply antifungal powder to prevent moisture.  Physical Exam: General:  Alert, NAD, pleasant, comfortable Abd/right groin:  Soft, ND, mild tenderness, incisions C/I, slightly moist, no signs of infection, dry dressing applied    Medication List         amLODipine 2.5 MG tablet  Commonly known as:  NORVASC  Take 2.5 mg by mouth daily.     chlorproMAZINE 50 MG tablet  Commonly known as:  THORAZINE  Take 250 mg by mouth at bedtime.     lisinopril 20 MG tablet  Commonly known as:  PRINIVIL,ZESTRIL  Take 20 mg by mouth daily.     LORazepam 0.5 MG tablet  Commonly known as:  ATIVAN  Take 0.5 mg by mouth 2 (two) times daily before a meal.     nystatin 100000 UNIT/GM Powd  Apply to groins & perineum to keep area dry twice daily as needed     polyethylene glycol packet  Commonly known as:  MIRALAX / GLYCOLAX  Take 17 g by mouth every other day.     traMADol 50 MG tablet  Commonly known as:  ULTRAM  Take 1-2 tablets (50-100 mg total) by mouth every 6 (six) hours  as needed for moderate pain or severe pain.             Follow-up Information   Follow up with CCS,MD, MD On 12/08/2012. (THE OFFICE SHOULD CALL YOU WITH AN APPOINTMENT TIME FOR STAPLE REMOVAL)    Specialty:  General Surgery   Contact information:   [address] [place] [address] [phone]       Follow up with LAYTON, BRIAN DAVID, DO. Schedule an appointment as soon as possible for a visit in 2 weeks. (FOR YOUR POST OPERATIVE CHECK)    Specialty:  General Surgery   Contact information:   [address] [place] [address] [phone]       Signed: Candiss Norse [facility] [phone]  12/01/2012, 9:31 AM

## 2012-12-08 ENCOUNTER — Ambulatory Visit (INDEPENDENT_AMBULATORY_CARE_PROVIDER_SITE_OTHER): Payer: Medicare Other

## 2012-12-08 DIAGNOSIS — Z8719 Personal history of other diseases of the digestive system: Secondary | ICD-10-CM

## 2012-12-08 DIAGNOSIS — Z9889 Other specified postprocedural states: Secondary | ICD-10-CM

## 2012-12-08 NOTE — Progress Notes (Signed)
Pt came in for nurse only to have staples removed. I removed all the staples from the abdomen and right groin. I applied steri strips to the incision. I applied dry gauze over the right groin incision. The pt tolerated the staple removal fine. The pt has a f/u appt next week with Dr Biagio Quint.

## 2012-12-15 ENCOUNTER — Ambulatory Visit (HOSPITAL_COMMUNITY)
Admission: RE | Admit: 2012-12-15 | Discharge: 2012-12-16 | Payer: Medicare Other | Source: Ambulatory Visit | Attending: General Surgery | Admitting: General Surgery

## 2012-12-15 ENCOUNTER — Encounter (HOSPITAL_COMMUNITY): Payer: Medicare Other | Admitting: Anesthesiology

## 2012-12-15 ENCOUNTER — Ambulatory Visit (HOSPITAL_COMMUNITY): Payer: Medicare Other

## 2012-12-15 ENCOUNTER — Other Ambulatory Visit (INDEPENDENT_AMBULATORY_CARE_PROVIDER_SITE_OTHER): Payer: Self-pay | Admitting: General Surgery

## 2012-12-15 ENCOUNTER — Ambulatory Visit (HOSPITAL_COMMUNITY): Payer: Medicare Other | Admitting: Anesthesiology

## 2012-12-15 ENCOUNTER — Encounter (HOSPITAL_COMMUNITY)
Admission: RE | Disposition: A | Discharge status: Other Acute Inpatient Hospital | Payer: Self-pay | Source: Ambulatory Visit | Attending: General Surgery

## 2012-12-15 ENCOUNTER — Telehealth (INDEPENDENT_AMBULATORY_CARE_PROVIDER_SITE_OTHER): Payer: Self-pay

## 2012-12-15 ENCOUNTER — Encounter (HOSPITAL_COMMUNITY): Payer: Self-pay | Admitting: *Deleted

## 2012-12-15 ENCOUNTER — Ambulatory Visit (INDEPENDENT_AMBULATORY_CARE_PROVIDER_SITE_OTHER): Payer: Medicare Other | Admitting: General Surgery

## 2012-12-15 ENCOUNTER — Other Ambulatory Visit (HOSPITAL_COMMUNITY): Payer: Self-pay | Admitting: General Surgery

## 2012-12-15 ENCOUNTER — Encounter (INDEPENDENT_AMBULATORY_CARE_PROVIDER_SITE_OTHER): Payer: Self-pay | Admitting: General Surgery

## 2012-12-15 VITALS — BP 110/58 | HR 65 | Temp 98.2°F | Resp 18 | Ht 72.0 in | Wt 159.6 lb

## 2012-12-15 DIAGNOSIS — E119 Type 2 diabetes mellitus without complications: Secondary | ICD-10-CM | POA: Insufficient documentation

## 2012-12-15 DIAGNOSIS — K612 Anorectal abscess: Secondary | ICD-10-CM

## 2012-12-15 DIAGNOSIS — K611 Rectal abscess: Secondary | ICD-10-CM

## 2012-12-15 DIAGNOSIS — Z79899 Other long term (current) drug therapy: Secondary | ICD-10-CM | POA: Insufficient documentation

## 2012-12-15 DIAGNOSIS — I10 Essential (primary) hypertension: Secondary | ICD-10-CM | POA: Insufficient documentation

## 2012-12-15 DIAGNOSIS — D72829 Elevated white blood cell count, unspecified: Secondary | ICD-10-CM | POA: Insufficient documentation

## 2012-12-15 DIAGNOSIS — F79 Unspecified intellectual disabilities: Secondary | ICD-10-CM | POA: Insufficient documentation

## 2012-12-15 DIAGNOSIS — Z9889 Other specified postprocedural states: Secondary | ICD-10-CM | POA: Insufficient documentation

## 2012-12-15 HISTORY — DX: Unspecified intracranial injury with loss of consciousness status unknown, initial encounter: S06.9XAA

## 2012-12-15 HISTORY — DX: Unspecified intracranial injury with loss of consciousness of unspecified duration, initial encounter: S06.9X9A

## 2012-12-15 HISTORY — DX: Anxiety disorder, unspecified: F41.9

## 2012-12-15 HISTORY — PX: INCISION AND DRAINAGE PERIRECTAL ABSCESS: SHX1804

## 2012-12-15 LAB — CBC WITH DIFFERENTIAL/PLATELET
Basophils Absolute: 0 10*3/uL (ref 0.0–0.1)
Basophils Relative: 0 % (ref 0–1)
Eosinophils Absolute: 0 10*3/uL (ref 0.0–0.7)
HCT: 35 % — ABNORMAL LOW (ref 39.0–52.0)
Hemoglobin: 11.7 g/dL — ABNORMAL LOW (ref 13.0–17.0)
Lymphocytes Relative: 7 % — ABNORMAL LOW (ref 12–46)
Lymphs Abs: 1.2 10*3/uL (ref 0.7–4.0)
MCH: 30.3 pg (ref 26.0–34.0)
MCHC: 33.4 g/dL (ref 30.0–36.0)
MCV: 90.7 fL (ref 78.0–100.0)
Monocytes Absolute: 1.1 10*3/uL — ABNORMAL HIGH (ref 0.1–1.0)
Monocytes Relative: 7 % (ref 3–12)
Platelets: 270 10*3/uL (ref 150–400)
RBC: 3.86 MIL/uL — ABNORMAL LOW (ref 4.22–5.81)

## 2012-12-15 LAB — BASIC METABOLIC PANEL
BUN: 17 mg/dL (ref 6–23)
CO2: 28 mEq/L (ref 19–32)
Chloride: 99 mEq/L (ref 96–112)
Glucose, Bld: 101 mg/dL — ABNORMAL HIGH (ref 70–99)
Potassium: 4.3 mEq/L (ref 3.5–5.1)

## 2012-12-15 SURGERY — INCISION AND DRAINAGE, ABSCESS, PERIRECTAL
Anesthesia: General | Wound class: Dirty or Infected

## 2012-12-15 MED ORDER — ONDANSETRON HCL 4 MG/2ML IJ SOLN: 4.0000 mg | Freq: Four times a day (QID) | INTRAMUSCULAR | Status: DC | PRN

## 2012-12-15 MED ORDER — TRAMADOL HCL 50 MG PO TABS: 50.0000 mg | ORAL_TABLET | Freq: Four times a day (QID) | ORAL | Status: DC | PRN

## 2012-12-15 MED ORDER — FENTANYL CITRATE 0.05 MG/ML IJ SOLN
INTRAMUSCULAR | Status: AC
  Filled 2012-12-15: qty 2

## 2012-12-15 MED ORDER — PROPOFOL 10 MG/ML IV BOLUS
INTRAVENOUS | Status: DC | PRN
  Administered 2012-12-15: 200 mg via INTRAVENOUS

## 2012-12-15 MED ORDER — NYSTATIN 100000 UNIT/GM EX POWD
Freq: Two times a day (BID) | CUTANEOUS | Status: DC
  Administered 2012-12-15 – 2012-12-16 (×2): via TOPICAL
  Filled 2012-12-15: qty 15

## 2012-12-15 MED ORDER — DEXTROSE 5 % IV SOLN
INTRAVENOUS | Status: AC
  Filled 2012-12-15 (×2): qty 1

## 2012-12-15 MED ORDER — ONDANSETRON HCL 4 MG PO TABS: 4.0000 mg | ORAL_TABLET | Freq: Four times a day (QID) | ORAL | Status: DC | PRN

## 2012-12-15 MED ORDER — ONDANSETRON HCL 4 MG/2ML IJ SOLN
INTRAMUSCULAR | Status: AC
  Filled 2012-12-15: qty 2

## 2012-12-15 MED ORDER — POLYETHYLENE GLYCOL 3350 17 G PO PACK: 17.0000 g | PACK | ORAL | Status: DC

## 2012-12-15 MED ORDER — MIDAZOLAM HCL 5 MG/5ML IJ SOLN
INTRAMUSCULAR | Status: DC | PRN
  Administered 2012-12-15: 2 mg via INTRAVENOUS

## 2012-12-15 MED ORDER — PROPOFOL 10 MG/ML IV BOLUS
INTRAVENOUS | Status: AC
  Filled 2012-12-15: qty 20

## 2012-12-15 MED ORDER — LORAZEPAM 0.5 MG PO TABS
0.5000 mg | ORAL_TABLET | Freq: Two times a day (BID) | ORAL | Status: DC
  Administered 2012-12-16: 0.5 mg via ORAL
  Filled 2012-12-15: qty 1

## 2012-12-15 MED ORDER — BUPIVACAINE-EPINEPHRINE 0.5% -1:200000 IJ SOLN
INTRAMUSCULAR | Status: DC | PRN
  Administered 2012-12-15: 5 mL

## 2012-12-15 MED ORDER — AMLODIPINE BESYLATE 2.5 MG PO TABS
2.5000 mg | ORAL_TABLET | Freq: Every day | ORAL | Status: DC
  Administered 2012-12-16: 2.5 mg via ORAL
  Filled 2012-12-15: qty 1

## 2012-12-15 MED ORDER — PROMETHAZINE HCL 25 MG/ML IJ SOLN: 6.2500 mg | INTRAMUSCULAR | Status: DC | PRN

## 2012-12-15 MED ORDER — 0.9 % SODIUM CHLORIDE (POUR BTL) OPTIME
TOPICAL | Status: DC | PRN
  Administered 2012-12-15: 1000 mL

## 2012-12-15 MED ORDER — CHLORPROMAZINE HCL 50 MG PO TABS
250.0000 mg | ORAL_TABLET | Freq: Every day | ORAL | Status: DC
  Administered 2012-12-15: 21:00:00 250 mg via ORAL
  Filled 2012-12-15 (×2): qty 1

## 2012-12-15 MED ORDER — LIDOCAINE HCL (CARDIAC) 20 MG/ML IV SOLN
INTRAVENOUS | Status: AC
  Filled 2012-12-15: qty 5

## 2012-12-15 MED ORDER — LISINOPRIL 20 MG PO TABS
20.0000 mg | ORAL_TABLET | Freq: Every day | ORAL | Status: DC
  Administered 2012-12-16: 20 mg via ORAL
  Filled 2012-12-15: qty 1

## 2012-12-15 MED ORDER — HYDROCODONE-ACETAMINOPHEN 5-325 MG PO TABS: 1.0000 | ORAL_TABLET | ORAL | Status: DC | PRN

## 2012-12-15 MED ORDER — HYDROMORPHONE HCL PF 1 MG/ML IJ SOLN: 0.2500 mg | INTRAMUSCULAR | Status: DC | PRN

## 2012-12-15 MED ORDER — ONDANSETRON HCL 4 MG/2ML IJ SOLN
INTRAMUSCULAR | Status: DC | PRN
  Administered 2012-12-15: 4 mg via INTRAVENOUS

## 2012-12-15 MED ORDER — LACTATED RINGERS IV SOLN
INTRAVENOUS | Status: DC
  Administered 2012-12-15: 1000 mL via INTRAVENOUS

## 2012-12-15 MED ORDER — FENTANYL CITRATE 0.05 MG/ML IJ SOLN
INTRAMUSCULAR | Status: AC
  Filled 2012-12-15: qty 5

## 2012-12-15 MED ORDER — DEXTROSE 5 % IV SOLN
1.0000 g | INTRAVENOUS | Status: AC
  Administered 2012-12-15: 2 g via INTRAVENOUS
  Filled 2012-12-15: qty 1

## 2012-12-15 MED ORDER — FENTANYL CITRATE 0.05 MG/ML IJ SOLN
INTRAMUSCULAR | Status: DC | PRN
  Administered 2012-12-15: 50 ug via INTRAVENOUS
  Administered 2012-12-15 (×2): 100 ug via INTRAVENOUS

## 2012-12-15 MED ORDER — SUCCINYLCHOLINE CHLORIDE 20 MG/ML IJ SOLN
INTRAMUSCULAR | Status: AC
  Filled 2012-12-15: qty 1

## 2012-12-15 MED ORDER — CIPROFLOXACIN IN D5W 400 MG/200ML IV SOLN: 400.0000 mg | INTRAVENOUS | Status: DC

## 2012-12-15 MED ORDER — SUCCINYLCHOLINE CHLORIDE 20 MG/ML IJ SOLN
INTRAMUSCULAR | Status: DC | PRN
  Administered 2012-12-15: 100 mg via INTRAVENOUS

## 2012-12-15 MED ORDER — LIDOCAINE HCL (PF) 2 % IJ SOLN
INTRAMUSCULAR | Status: DC | PRN
  Administered 2012-12-15: 75 mg via INTRADERMAL

## 2012-12-15 MED ORDER — MIDAZOLAM HCL 2 MG/2ML IJ SOLN
INTRAMUSCULAR | Status: AC
  Filled 2012-12-15: qty 2

## 2012-12-15 MED ORDER — BUPIVACAINE-EPINEPHRINE PF 0.5-1:200000 % IJ SOLN
INTRAMUSCULAR | Status: AC
  Filled 2012-12-15: qty 30

## 2012-12-15 MED ORDER — DEXTROSE 5 % IV SOLN
1.0000 g | Freq: Four times a day (QID) | INTRAVENOUS | Status: DC
  Administered 2012-12-15 – 2012-12-16 (×2): 1 g via INTRAVENOUS
  Filled 2012-12-15 (×3): qty 1

## 2012-12-15 SURGICAL SUPPLY — 27 items
BLADE HEX COATED 2.75 (ELECTRODE) ×2 IMPLANT
BLADE SURG SZ10 CARB STEEL (BLADE) ×2 IMPLANT
DRAPE LAPAROTOMY T 102X78X121 (DRAPES) ×2 IMPLANT
DRSG PAD ABDOMINAL 8X10 ST (GAUZE/BANDAGES/DRESSINGS) ×2 IMPLANT
ELECT REM PT RETURN 9FT ADLT (ELECTROSURGICAL) ×2
ELECTRODE REM PT RTRN 9FT ADLT (ELECTROSURGICAL) ×1 IMPLANT
GAUZE SPONGE 4X4 16PLY XRAY LF (GAUZE/BANDAGES/DRESSINGS) ×2 IMPLANT
GLOVE BIOGEL PI IND STRL 7.0 (GLOVE) ×1 IMPLANT
GLOVE BIOGEL PI INDICATOR 7.0 (GLOVE) ×1
GLOVE ECLIPSE 8.0 STRL XLNG CF (GLOVE) ×2 IMPLANT
GLOVE INDICATOR 8.0 STRL GRN (GLOVE) ×4 IMPLANT
GOWN PREVENTION PLUS LG XLONG (DISPOSABLE) ×2 IMPLANT
GOWN STRL REIN XL XLG (GOWN DISPOSABLE) ×4 IMPLANT
KIT BASIN OR (CUSTOM PROCEDURE TRAY) ×2 IMPLANT
LABEL STERILE EO BLANK 1X3 WHT (LABEL) ×2 IMPLANT
LUBRICANT JELLY K Y 4OZ (MISCELLANEOUS) ×2 IMPLANT
NEEDLE HYPO 25X1 1.5 SAFETY (NEEDLE) ×2 IMPLANT
PACK LITHOTOMY IV (CUSTOM PROCEDURE TRAY) ×2 IMPLANT
PENCIL BUTTON HOLSTER BLD 10FT (ELECTRODE) ×2 IMPLANT
SPONGE GAUZE 4X4 12PLY (GAUZE/BANDAGES/DRESSINGS) ×2 IMPLANT
SPONGE LAP 18X18 X RAY DECT (DISPOSABLE) ×2 IMPLANT
SPONGE SURGIFOAM ABS GEL 12-7 (HEMOSTASIS) IMPLANT
SYR CONTROL 10ML LL (SYRINGE) ×2 IMPLANT
TAPE CLOTH SURG 4X10 WHT LF (GAUZE/BANDAGES/DRESSINGS) ×2 IMPLANT
TOWEL OR 17X26 10 PK STRL BLUE (TOWEL DISPOSABLE) ×2 IMPLANT
TUBING CONNECTING 10 (TUBING) ×2 IMPLANT
YANKAUER SUCT BULB TIP 10FT TU (MISCELLANEOUS) ×2 IMPLANT

## 2012-12-15 NOTE — Anesthesia Postprocedure Evaluation (Signed)
  Anesthesia Post-op Note  Patient: Lance [name]  Procedure(s) Performed: Procedure(s) (LRB): exam under anesthesia, complex incision and drainage of anorectal abscess, anoscopy (N/A)  Patient Location: PACU  Anesthesia Type: General  Level of Consciousness: awake and alert   Airway and Oxygen Therapy: Patient Spontanous Breathing  Post-op Pain: mild  Post-op Assessment: Post-op Vital signs reviewed, Patient's Cardiovascular Status Stable, Respiratory Function Stable, Patent Airway and No signs of Nausea or vomiting  Last Vitals:  Filed Vitals:   12/15/12 1547  BP: 124/69  Pulse: 84  Temp: 36.5 C  Resp: 20    Post-op Vital Signs: stable   Complications: No apparent anesthesia complications

## 2012-12-15 NOTE — Telephone Encounter (Signed)
Called [name] to give patient's name, D.O.B, Diagnosis.  Patient will report to [facility] Stay

## 2012-12-15 NOTE — Progress Notes (Signed)
Patient ID: Gaylon Bentz, male   DOB: 03/31/1950, 62 y.o.   MRN: [id number]  Chief Complaint  Patient presents with  . Routine Post Op    hernia    HPI Esaul Dorwart is a 62 y.o. male.  HPI this patient is known to me for prior open repair of a strangulated right inguinal hernia about 2-1/2 weeks ago. He also had a small ischemic patulous colon which was laparoscopically oversewn. The history is difficult to obtain but he is accompanied by one of his caregivers. He has been doing fairly well and is eating well. He says he moves his bowels frequently. He has not had any abdominal pain or groin pain and seems to be recovering fine from this. His only complaint today is his pain and his backside and concern for "hemorrhoids". He did not have any fever or chills.  Past Medical History  Diagnosis Date  . Hypertension   . Psychotic disorder     Past Surgical History  Procedure Laterality Date  . Colonoscopy    . Inguinal hernia repair N/A 11/28/2012    Procedure: Diagnostic right strangulated INGUINAL HERNIA repair, and oversew on colon;  Surgeon: Lodema Pilot, DO;  Location: [facility];  Service: General;  Laterality: N/A;    No family history on file.  Social History History  Substance Use Topics  . Smoking status: Never Smoker   . Smokeless tobacco: Never Used  . Alcohol Use: No    No Known Allergies  Current Outpatient Prescriptions  Medication Sig Dispense Refill  . amLODipine (NORVASC) 2.5 MG tablet Take 2.5 mg by mouth daily.      . chlorproMAZINE (THORAZINE) 50 MG tablet Take 250 mg by mouth at bedtime.       Marland Kitchen lisinopril (PRINIVIL,ZESTRIL) 20 MG tablet Take 20 mg by mouth daily.        Marland Kitchen LORazepam (ATIVAN) 0.5 MG tablet Take 0.5 mg by mouth 2 (two) times daily before a meal.        . nystatin (MYCOSTATIN/NYSTOP) 100000 UNIT/GM POWD Apply to groins & perineum to keep area dry twice daily as needed  1 Bottle  0  . polyethylene glycol (MIRALAX / GLYCOLAX) packet Take 17  g by mouth every other day.      . traMADol (ULTRAM) 50 MG tablet Take 1-2 tablets (50-100 mg total) by mouth every 6 (six) hours as needed for moderate pain or severe pain.  40 tablet  0   No current facility-administered medications for this visit.    Review of Systems Review of Systems All other review of systems negative or noncontributory except as stated in the HPI  Blood pressure 110/58, pulse 65, temperature 98.2 F (36.8 C), resp. rate 18, height 6' (1.829 m), weight 159 lb 9.6 oz (72.394 kg).  Physical Exam Physical Exam Physical Exam  Vitals reviewed. Constitutional:  No distress. nontoxic HENT:  Head: Normocephalic and atraumatic.  Mouth/Throat: No oropharyngeal exudate.  Eyes: Conjunctivae and EOM are normal. Pupils are equal, round, and reactive to light. Right eye exhibits no discharge. Left eye exhibits no discharge. No scleral icterus.  Neck: Normal range of motion. No tracheal deviation present.  Cardiovascular: Normal rate, regular rhythm and normal heart sounds.   Pulmonary/Chest: Effort normal and breath sounds normal. No stridor. No respiratory distress. He has no wheezes. He has no rales. He exhibits no tenderness.  Abdominal: Soft. Bowel sounds are normal. He exhibits no distension and no mass. There is no tenderness. There is no  rebound and no guarding. his incision is healing well, no sign of infection, I did apply some silver nitrate to some granulation tissue.  But no evidence of recurrence. Musculoskeletal: Normal range of motion. He exhibits no edema and no tenderness.  Neurological: He is alert and oriented to person, place, and time.  Skin: Skin is warm and dry. No rash noted. He is not diaphoretic. No erythema. No pallor.  Psychiatric: He has a normal mood and affect. His behavior is normal. Judgment and thought content normal.  Rectal: anterior to the rectum he has a hard tender area with some erythema and tenderness, he has a 5cm area of induration  concerning for perirectal abscess  Data Reviewed   Assessment    Perirectal abscess I think that his buttock complaints are due to the perirectal/perineal abscess. He does have some hemorrhoids but I do not think that this is the cause of his symptoms. Otherwise he seen to be doing well from his surgery and his abdomen appears benign and there is no evidence of recurrent hernia. I have recommended transferring him to [facility] long for rectal exam under anesthesia and likely incision and drainage of perirectal abscess by our on-call physician at the hospital    Plan    We will plan for transfer to [facility] long hospital for rectal exam under anesthesia and likely incision and drainage       Amardeep Beckers DAVID 12/15/2012, 10:53 AM

## 2012-12-15 NOTE — H&P (View-Only) (Signed)
Patient ID: Lance [name], male   DOB: 08/03/1950, 62 y.o.   MRN: [id number]  Chief Complaint  Patient presents with  . Routine Post Op    hernia    HPI Lance [name] is a 62 y.o. male.  HPI this patient is known to me for prior open repair of a strangulated right inguinal hernia about 2-1/2 weeks ago. He also had a small ischemic patulous colon which was laparoscopically oversewn. The history is difficult to obtain but he is accompanied by one of his caregivers. He has been doing fairly well and is eating well. He says he moves his bowels frequently. He has not had any abdominal pain or groin pain and seems to be recovering fine from this. His only complaint today is his pain and his backside and concern for "hemorrhoids". He did not have any fever or chills.  Past Medical History  Diagnosis Date  . Hypertension   . Psychotic disorder     Past Surgical History  Procedure Laterality Date  . Colonoscopy    . Inguinal hernia repair N/A 11/28/2012    Procedure: Diagnostic right strangulated INGUINAL HERNIA repair, and oversew on colon;  Surgeon: Marasia Newhall, DO;  Location: [facility];  Service: General;  Laterality: N/A;    No family history on file.  Social History History  Substance Use Topics  . Smoking status: Never Smoker   . Smokeless tobacco: Never Used  . Alcohol Use: No    No Known Allergies  Current Outpatient Prescriptions  Medication Sig Dispense Refill  . amLODipine (NORVASC) 2.5 MG tablet Take 2.5 mg by mouth daily.      . chlorproMAZINE (THORAZINE) 50 MG tablet Take 250 mg by mouth at bedtime.       . lisinopril (PRINIVIL,ZESTRIL) 20 MG tablet Take 20 mg by mouth daily.        . LORazepam (ATIVAN) 0.5 MG tablet Take 0.5 mg by mouth 2 (two) times daily before a meal.        . nystatin (MYCOSTATIN/NYSTOP) 100000 UNIT/GM POWD Apply to groins & perineum to keep area dry twice daily as needed  1 Bottle  0  . polyethylene glycol (MIRALAX / GLYCOLAX) packet Take 17  g by mouth every other day.      . traMADol (ULTRAM) 50 MG tablet Take 1-2 tablets (50-100 mg total) by mouth every 6 (six) hours as needed for moderate pain or severe pain.  40 tablet  0   No current facility-administered medications for this visit.    Review of Systems Review of Systems All other review of systems negative or noncontributory except as stated in the HPI  Blood pressure 110/58, pulse 65, temperature 98.2 F (36.8 C), resp. rate 18, height 6' (1.829 m), weight 159 lb 9.6 oz (72.394 kg).  Physical Exam Physical Exam Physical Exam  Vitals reviewed. Constitutional:  No distress. nontoxic HENT:  Head: Normocephalic and atraumatic.  Mouth/Throat: No oropharyngeal exudate.  Eyes: Conjunctivae and EOM are normal. Pupils are equal, round, and reactive to light. Right eye exhibits no discharge. Left eye exhibits no discharge. No scleral icterus.  Neck: Normal range of motion. No tracheal deviation present.  Cardiovascular: Normal rate, regular rhythm and normal heart sounds.   Pulmonary/Chest: Effort normal and breath sounds normal. No stridor. No respiratory distress. He has no wheezes. He has no rales. He exhibits no tenderness.  Abdominal: Soft. Bowel sounds are normal. He exhibits no distension and no mass. There is no tenderness. There is no   rebound and no guarding. his incision is healing well, no sign of infection, I did apply some silver nitrate to some granulation tissue.  But no evidence of recurrence. Musculoskeletal: Normal range of motion. He exhibits no edema and no tenderness.  Neurological: He is alert and oriented to person, place, and time.  Skin: Skin is warm and dry. No rash noted. He is not diaphoretic. No erythema. No pallor.  Psychiatric: He has a normal mood and affect. His behavior is normal. Judgment and thought content normal.  Rectal: anterior to the rectum he has a hard tender area with some erythema and tenderness, he has a 5cm area of induration  concerning for perirectal abscess  Data Reviewed   Assessment    Perirectal abscess I think that his buttock complaints are due to the perirectal/perineal abscess. He does have some hemorrhoids but I do not think that this is the cause of his symptoms. Otherwise he seen to be doing well from his surgery and his abdomen appears benign and there is no evidence of recurrent hernia. I have recommended transferring him to [place] for rectal exam under anesthesia and likely incision and drainage of perirectal abscess by our on-call physician at the hospital    Plan    We will plan for transfer to [place] hospital for rectal exam under anesthesia and likely incision and drainage       Lance [name] DAVID 12/15/2012, 10:53 AM    

## 2012-12-15 NOTE — Op Note (Signed)
Operative Note  Lance [name] male 62 y.o. 12/15/2012  PREOPERATIVE DX:  Anterior anorectal abscess  POSTOPERATIVE DX:  Same  PROCEDURE:  Examination under anesthesia, complex drainage of anorectal abscess, anoscopy         Surgeon: Adolph Pollack   Assistants: Britt Bottom PA student  Anesthesia: General endotracheal anesthesia  Indications: This is a 62 year old male who underwent emergency repair of an incarcerated right inguinal hernia last week. He presented for his postoperative visit and was complaining of a painful area anterior to the anus. On exam it was felt that he had an abscess. He was transferred to the hospital and is now brought to the operating room for the above procedure.    Procedure Detail:  He is brought to the operating room supine on the stretcher. A general anesthetic was given. He was then moved over to the operating room table and placed in the lithotomy position. The perianal area were sterilely prepped and draped.  Using a 19-gauge needle I palpated a fluctuant mass in the anterior anal area. Digital rectal exam did not demonstrate any masses. I aspirated the mass and purulent material was returned.  This was sent for culture. Subsequently I sharply made a cruciate incision in the perineal body area anterior to the anus. A large amount of purulent fluid was evacuated. The deep abscess cavity was noted. Some necrotic tissue was debrided. I cut the corners off 2 of the areas of the cruciate incision opening up the cavity. I examined the area digitally and there no loculations. Anoscopy was performed and there is no evidence of fistula.  Bleeding from the wound edges was controlled electrocautery. The bone was anesthetized with Marcaine solution. Moist gauze was packed into the open wound followed by bulky dry dressing.  He tolerated the procedure without any apparent complications and was taken to recovery in satisfactory condition the  Estimated  Blood Loss:  less than 100 mL         Drains: none  Blood Given: none          Specimens: Abscess fluid sent for culture        Complications:  * No complications entered in OR log *         Disposition: PACU - hemodynamically stable.         Condition: stable

## 2012-12-15 NOTE — Progress Notes (Signed)
Health history questions were answered today by patient's caregiver Karie Mainland

## 2012-12-15 NOTE — Transfer of Care (Signed)
Immediate Anesthesia Transfer of Care Note  Patient: Lance [name]  Procedure(s) Performed: Procedure(s): exam under anesthesia, complex incision and drainage of anorectal abscess, anoscopy (N/A)  Patient Location: PACU  Anesthesia Type:General  Level of Consciousness: awake, sedated and patient cooperative  Airway & Oxygen Therapy: Patient Spontanous Breathing and Patient connected to face mask oxygen  Post-op Assessment: Report given to PACU RN, Post -op Vital signs reviewed and stable and Patient moving all extremities X 4  Post vital signs: Reviewed and stable  Complications: No apparent anesthesia complications

## 2012-12-15 NOTE — Interval H&P Note (Signed)
History and Physical Interval Note:  12/15/2012 2:21 PM  Lance [name]  has presented today for surgery, with the diagnosis of PERIRECTAL ABCESS  The various methods of treatment have been discussed with the patient and family.  I have seen and examined Lance [name] and discussed the situation with Dr. Biagio Quint.  He has an indurated, tender mass in the anterior anal area with leukocytosis. After consideration of risks, benefits and other options for treatment, the patient has consented to  Procedure(s): IRRIGATION AND DEBRIDEMENT PERIRECTAL ABSCESS (N/A) as a surgical intervention .  The patient's history has been reviewed, patient examined, no change in status, stable for surgery.  I have reviewed the patient's chart and labs.  Questions were answered to the patient's satisfaction.     Pauletta Pickney Shela Commons

## 2012-12-15 NOTE — Anesthesia Preprocedure Evaluation (Signed)
Anesthesia Evaluation  Patient identified by MRN, date of birth, ID band Patient awake    Reviewed: Allergy & Precautions, H&P , NPO status , Patient's Chart, lab work & pertinent test results  Airway Mallampati: II TM Distance: >3 FB Neck ROM: Full    Dental no notable dental hx.    Pulmonary neg pulmonary ROS,  breath sounds clear to auscultation  Pulmonary exam normal       Cardiovascular hypertension, Pt. on medications Rhythm:Regular Rate:Normal     Neuro/Psych MENTAL RETARDATION    TRAUMATIC BRAIN INJURY   negative psych ROS   GI/Hepatic negative GI ROS, Neg liver ROS,   Endo/Other  diabetes  Renal/GU negative Renal ROS  negative genitourinary   Musculoskeletal negative musculoskeletal ROS (+)   Abdominal   Peds negative pediatric ROS (+)  Hematology negative hematology ROS (+)   Anesthesia Other Findings   Reproductive/Obstetrics negative OB ROS                           Anesthesia Physical Anesthesia Plan  ASA: II  Anesthesia Plan: General   Post-op Pain Management:    Induction: Intravenous  Airway Management Planned: Oral ETT  Additional Equipment:   Intra-op Plan:   Post-operative Plan: Extubation in OR  Informed Consent: I have reviewed the patients History and Physical, chart, labs and discussed the procedure including the risks, benefits and alternatives for the proposed anesthesia with the patient or authorized representative who has indicated his/her understanding and acceptance.   Dental advisory given  Plan Discussed with: CRNA and Surgeon  Anesthesia Plan Comments:         Anesthesia Quick Evaluation

## 2012-12-16 DIAGNOSIS — K612 Anorectal abscess: Secondary | ICD-10-CM | POA: Diagnosis present

## 2012-12-16 MED ORDER — AMOXICILLIN-POT CLAVULANATE 875-125 MG PO TABS: 1.0000 | ORAL_TABLET | Freq: Two times a day (BID) | ORAL | Status: DC

## 2012-12-16 NOTE — Progress Notes (Signed)
1 Day Post-Op  Subjective: No pain.  Objective: Vital signs in last 24 hours: Temp:  [97.5 F (36.4 C)-98.5 F (36.9 C)] 98.2 F (36.8 C) (11/22 0537) Pulse Rate:  [65-110] 68 (11/22 0537) Resp:  [10-20] 18 (11/22 0537) BP: (98-144)/(58-87) 121/78 mmHg (11/22 0950) SpO2:  [96 %-100 %] 96 % (11/22 0537) Weight:  [159 lb 9.5 oz (72.392 kg)-159 lb 9.6 oz (72.394 kg)] 159 lb 9.5 oz (72.392 kg) (11/22 0217)    Intake/Output from previous day: 11/21 0701 - 11/22 0700 In: 1300 [P.O.:600; I.V.:700] Out: -  Intake/Output this shift: Total I/O In: 120 [P.O.:120] Out: -   PE: General- In NAD Anorectal-packing removed, wound clean  Lab Results:   Recent Labs  12/15/12 1320  WBC 15.5*  HGB 11.7*  HCT 35.0*  PLT 270   BMET  Recent Labs  12/15/12 1320  NA 136  K 4.3  CL 99  CO2 28  GLUCOSE 101*  BUN 17  CREATININE 0.96  CALCIUM 9.4   PT/INR No results found for this basename: LABPROT, INR,  in the last 72 hours Comprehensive Metabolic Panel:    Component Value Date/Time   NA 136 12/15/2012 1320   K 4.3 12/15/2012 1320   CL 99 12/15/2012 1320   CO2 28 12/15/2012 1320   BUN 17 12/15/2012 1320   CREATININE 0.96 12/15/2012 1320   GLUCOSE 101* 12/15/2012 1320   CALCIUM 9.4 12/15/2012 1320   AST 17 05/27/2008 2252   ALT 14 05/27/2008 2252   ALKPHOS 83 05/27/2008 2252   BILITOT 0.6 05/27/2008 2252   PROT 6.9 05/27/2008 2252   ALBUMIN 4.1 05/27/2008 2252     Studies/Results: Dg Chest 2 View  12/15/2012   CLINICAL DATA:  Perirectal abscess.  EXAM: CHEST  2 VIEW  COMPARISON:  None.  FINDINGS: Lungs are clear. Heart size is normal. No pneumothorax or pleural fluid.  IMPRESSION: No acute disease.   Electronically Signed   By: Drusilla Kanner M.D.   On: 12/15/2012 14:02    Anti-infectives: Anti-infectives   Start     Dose/Rate Route Frequency Ordered Stop   12/16/12 0600  ciprofloxacin (CIPRO) IVPB 400 mg  Status:  Discontinued     400 mg 200 mL/hr over 60 Minutes  Intravenous On call to O.R. 12/15/12 1203 12/15/12 1231   12/16/12 0600  cefOXitin (MEFOXIN) 1 g in dextrose 5 % 50 mL IVPB     1 g 100 mL/hr over 30 Minutes Intravenous On call to O.R. 12/15/12 1231 12/15/12 1456   12/15/12 2100  cefOXitin (MEFOXIN) 1 g in dextrose 5 % 50 mL IVPB     1 g 100 mL/hr over 30 Minutes Intravenous Every 6 hours 12/15/12 1713 12/16/12 1459      Assessment Principal Problem:   Anorectal abscess s/p incision and drainage 12/15/12-doing well    LOS: 1 day   Plan: Discharge.  Follow up in office in 3 weeks.   Lance [name] 12/16/2012

## 2012-12-16 NOTE — Progress Notes (Signed)
Per MD, Pt ready for d/c.  Notified Pt's caregiver, brother and group home.  Caregiver to provide transportation.  Mr. Ledell Peoples at group home stated that [facility] is not necessary.  He requested the number for the unit, as the group home's part-time RN may have questions re: d/c paperwork.  Mr. Ledell Peoples stated that it's not necessary for our RN to give report.  No further CSW needs.  Pt to be d/c'd.  Providence Crosby, LCSWA Clinical Social Work [phone]

## 2012-12-16 NOTE — Discharge Summary (Signed)
Physician Discharge Summary  Patient ID: Lance [name] MRN: [id number] DOB/AGE: 62-Aug-1952 62 y.o.  Admit date: 12/15/2012 Discharge date: 12/16/2012  Admission Diagnoses:  Anterior anorectal abscess  Discharge Diagnoses:  Principal Problem:   Anorectal abscess s/p incision and drainage 12/15/12  Mental retardation  Hypertension  Diabetes mellitus   Discharged Condition: good  Hospital Course: He was admitted to the hospital and taken to the operating room where he had a complex incision and drainage of his anorectal abscess 12/15/2012.  He tolerated this well. The packing was removed on his first postoperative day. He was able to be discharged. He'll be discharged home on oral on 10. Wound care instructions will be given. Return visit in the office in approximately 2-3 weeks.  Consults: None  Significant Diagnostic Studies: None  Treatments: surgery: Incision and drainage of anorectal abscess  Discharge Exam: Blood pressure 121/78, pulse 68, temperature 98.2 F (36.8 C), temperature source Oral, resp. rate 18, height 6' (1.829 m), weight 159 lb 9.5 oz (72.392 kg), SpO2 96.00%.   Disposition: 01-Group home.     Medication List         amLODipine 2.5 MG tablet  Commonly known as:  NORVASC  Take 2.5 mg by mouth daily.     amoxicillin-clavulanate 875-125 MG per tablet  Commonly known as:  AUGMENTIN  Take 1 tablet by mouth 2 (two) times daily.     chlorproMAZINE 50 MG tablet  Commonly known as:  THORAZINE  Take 250 mg by mouth at bedtime.     lisinopril 20 MG tablet  Commonly known as:  PRINIVIL,ZESTRIL  Take 20 mg by mouth daily.     LORazepam 0.5 MG tablet  Commonly known as:  ATIVAN  Take 0.5 mg by mouth 2 (two) times daily before a meal.     nystatin 100000 UNIT/GM Powd  Apply to groins & perineum to keep area dry twice daily as needed     polyethylene glycol packet  Commonly known as:  MIRALAX / GLYCOLAX  Take 17 g by mouth every other day.     traMADol 50 MG tablet  Commonly known as:  ULTRAM  Take 1-2 tablets (50-100 mg total) by mouth every 6 (six) hours as needed for moderate pain or severe pain.         Signed: Adolph Pollack 12/16/2012, 10:05 AM

## 2012-12-16 NOTE — Progress Notes (Signed)
Discharge instructions, including wound care and medications, discussed with Lance [name] from [facility]. Discharge packet sent with her. Discharged via wheelchair accompanied by Koleen Nimrod

## 2012-12-18 ENCOUNTER — Telehealth (INDEPENDENT_AMBULATORY_CARE_PROVIDER_SITE_OTHER): Payer: Self-pay | Admitting: *Deleted

## 2012-12-18 ENCOUNTER — Other Ambulatory Visit (INDEPENDENT_AMBULATORY_CARE_PROVIDER_SITE_OTHER): Payer: Self-pay | Admitting: *Deleted

## 2012-12-18 ENCOUNTER — Encounter (HOSPITAL_COMMUNITY): Payer: Self-pay | Admitting: General Surgery

## 2012-12-18 NOTE — Telephone Encounter (Signed)
Colace 100 mg p.o. Bid Dispense #60, refill 3

## 2012-12-18 NOTE — Telephone Encounter (Signed)
Called and left message for patient to call us back with which pharmacy he uses.  [facility] pharmacy [facility] [place].  Awaiting patient's call back to be able to prescribe below per Dr. Abbey Chatters.

## 2012-12-18 NOTE — Telephone Encounter (Signed)
Patient walked into office today asking for a prescription for a stool softener.  Patient states that he lives in a group home and they will not allow him to take OTC medication they require a prescription.  Explained that I would send a message to Dr. Abbey Chatters to ask about it and then we can let him know.  Patient states understanding and agreeable at this time.

## 2012-12-19 NOTE — Progress Notes (Signed)
Received call from facility that pt stays at stating they need documented note of how to give colace. They state they do not need written rx. They order and give meds to their pts by MD orders in chart. I advised Lance [name] that a copy of Dr Maris Berger order for colace will be at front desk to pick up.

## 2012-12-20 LAB — ANAEROBIC CULTURE: Gram Stain: NONE SEEN

## 2012-12-20 LAB — CULTURE, ROUTINE-ABSCESS: Gram Stain: NONE SEEN

## 2013-01-02 ENCOUNTER — Encounter (INDEPENDENT_AMBULATORY_CARE_PROVIDER_SITE_OTHER): Payer: Self-pay

## 2013-01-02 ENCOUNTER — Ambulatory Visit (INDEPENDENT_AMBULATORY_CARE_PROVIDER_SITE_OTHER): Payer: Medicare Other | Admitting: General Surgery

## 2013-01-02 VITALS — BP 126/70 | HR 64 | Temp 98.0°F | Resp 18 | Ht 67.0 in | Wt 167.0 lb

## 2013-01-02 DIAGNOSIS — Z4889 Encounter for other specified surgical aftercare: Secondary | ICD-10-CM

## 2013-01-02 NOTE — Progress Notes (Signed)
Procedure:  Incision and drainage of anorectal abscess  Date:  12/15/2012  History:  He is here for his first postoperative visit with his caretaker. He is doing fairly well.  Exam: General- Is in NAD. Anorectal-Anterior wound is clean and healing well by secondary intention.  Assessment:  Abscess wound healing in well with current care.  Plan:  Continue current wound care. Return visit one month.

## 2013-01-02 NOTE — Patient Instructions (Signed)
Continue current wound care

## 2013-02-01 ENCOUNTER — Encounter (INDEPENDENT_AMBULATORY_CARE_PROVIDER_SITE_OTHER): Payer: Medicare Other | Admitting: General Surgery

## 2013-02-26 ENCOUNTER — Ambulatory Visit (INDEPENDENT_AMBULATORY_CARE_PROVIDER_SITE_OTHER): Payer: Medicare Other | Admitting: General Surgery

## 2013-02-26 ENCOUNTER — Encounter (INDEPENDENT_AMBULATORY_CARE_PROVIDER_SITE_OTHER): Payer: Self-pay | Admitting: General Surgery

## 2013-02-26 VITALS — BP 120/70 | HR 71 | Resp 16 | Ht 67.0 in | Wt 169.0 lb

## 2013-02-26 DIAGNOSIS — Z4889 Encounter for other specified surgical aftercare: Secondary | ICD-10-CM

## 2013-02-26 NOTE — Progress Notes (Signed)
Procedure:  Incision and drainage of anorectal abscess  Date:  12/15/2012  History:  He is here for his another postoperative visit with his caretaker and is doing well.  Exam: General- Is in NAD. Anorectal-Anterior wound is healed Assessment:  Abscess wound healed.  Plan:  Return prn.

## 2013-02-26 NOTE — Patient Instructions (Signed)
Call if you have any problems related to the anal area.

## 2013-04-01 ENCOUNTER — Emergency Department (HOSPITAL_COMMUNITY)
Admission: EM | Admit: 2013-04-01 | Discharge: 2013-04-01 | Disposition: A | Discharge status: Home / Self Care | Payer: Medicare Other | Attending: Emergency Medicine | Admitting: Emergency Medicine

## 2013-04-01 ENCOUNTER — Encounter (HOSPITAL_COMMUNITY): Payer: Self-pay | Admitting: Emergency Medicine

## 2013-04-01 ENCOUNTER — Emergency Department (HOSPITAL_COMMUNITY): Payer: Medicare Other

## 2013-04-01 DIAGNOSIS — S0990XA Unspecified injury of head, initial encounter: Secondary | ICD-10-CM | POA: Insufficient documentation

## 2013-04-01 DIAGNOSIS — Y9389 Activity, other specified: Secondary | ICD-10-CM | POA: Insufficient documentation

## 2013-04-01 DIAGNOSIS — W1809XA Striking against other object with subsequent fall, initial encounter: Secondary | ICD-10-CM | POA: Insufficient documentation

## 2013-04-01 DIAGNOSIS — S0100XA Unspecified open wound of scalp, initial encounter: Secondary | ICD-10-CM | POA: Insufficient documentation

## 2013-04-01 DIAGNOSIS — Z792 Long term (current) use of antibiotics: Secondary | ICD-10-CM | POA: Insufficient documentation

## 2013-04-01 DIAGNOSIS — Z79899 Other long term (current) drug therapy: Secondary | ICD-10-CM | POA: Insufficient documentation

## 2013-04-01 DIAGNOSIS — Z23 Encounter for immunization: Secondary | ICD-10-CM | POA: Insufficient documentation

## 2013-04-01 DIAGNOSIS — F411 Generalized anxiety disorder: Secondary | ICD-10-CM | POA: Insufficient documentation

## 2013-04-01 DIAGNOSIS — Y929 Unspecified place or not applicable: Secondary | ICD-10-CM | POA: Insufficient documentation

## 2013-04-01 DIAGNOSIS — I10 Essential (primary) hypertension: Secondary | ICD-10-CM | POA: Insufficient documentation

## 2013-04-01 DIAGNOSIS — S0191XA Laceration without foreign body of unspecified part of head, initial encounter: Secondary | ICD-10-CM

## 2013-04-01 MED ORDER — TETANUS-DIPHTH-ACELL PERTUSSIS 5-2.5-18.5 LF-MCG/0.5 IM SUSP
0.5000 mL | Freq: Once | INTRAMUSCULAR | Status: AC
  Administered 2013-04-01: 0.5 mL via INTRAMUSCULAR
  Filled 2013-04-01: qty 0.5

## 2013-04-01 NOTE — ED Provider Notes (Signed)
CSN: [id number]     Arrival date & time 04/01/13  0654 History   First MD Initiated Contact with Patient 04/01/13 0700     Chief Complaint  Patient presents with  . Fall  . Head Laceration     (Consider location/radiation/quality/duration/timing/severity/associated sxs/prior Treatment) Patient is a 63 y.o. male presenting with fall and scalp laceration. The history is provided by the EMS personnel (group home). The history is limited by the condition of the patient (pt with TBI).  Fall This is a new (at 0600 pt got up to go to the bathroom and fell hitting his head on the corner of the wall) problem. The current episode started 1 to 2 hours ago. The problem occurs constantly. The problem has not changed since onset.Associated symptoms comments: Pt has no c/o of pain anywhere when asked.  Unknown LoC.  Denies numbness and weakness and states he his tired.. Nothing aggravates the symptoms. Nothing relieves the symptoms. He has tried nothing for the symptoms. The treatment provided no relief.  Head Laceration    Past Medical History  Diagnosis Date  . Hypertension   . Psychotic disorder   . Anxiety   . Traumatic brain injury    Past Surgical History  Procedure Laterality Date  . Colonoscopy    . Inguinal hernia repair N/A 11/28/2012    Procedure: Diagnostic right strangulated INGUINAL HERNIA repair, and oversew on colon;  Surgeon: Lodema PilotBrian Layton, DO;  Location: [facility];  Service: General;  Laterality: N/A;  . Incision and drainage perirectal abscess N/A 12/15/2012    Procedure: exam under anesthesia, complex incision and drainage of anorectal abscess, anoscopy;  Surgeon: Adolph Pollackodd J Rosenbower, MD;  Location: [facility];  Service: General;  Laterality: N/A;   History reviewed. No pertinent family history. History  Substance Use Topics  . Smoking status: Never Smoker   . Smokeless tobacco: Never Used  . Alcohol Use: No    Review of Systems  All other systems reviewed and are  negative.      Allergies  Review of patient's allergies indicates no known allergies.  Home Medications   Current Outpatient Rx  Name  Route  Sig  Dispense  Refill  . amLODipine (NORVASC) 2.5 MG tablet   Oral   Take 2.5 mg by mouth daily.         Marland Kitchen. amoxicillin-clavulanate (AUGMENTIN) 875-125 MG per tablet   Oral   Take 1 tablet by mouth 2 (two) times daily.   14 tablet   0   . chlorproMAZINE (THORAZINE) 50 MG tablet   Oral   Take 250 mg by mouth at bedtime.          Marland Kitchen. lisinopril (PRINIVIL,ZESTRIL) 20 MG tablet   Oral   Take 20 mg by mouth daily.           Marland Kitchen. LORazepam (ATIVAN) 0.5 MG tablet   Oral   Take 0.5 mg by mouth 2 (two) times daily before a meal.           . nystatin (MYCOSTATIN/NYSTOP) 100000 UNIT/GM POWD      Apply to groins & perineum to keep area dry twice daily as needed   1 Bottle   0   . polyethylene glycol (MIRALAX / GLYCOLAX) packet   Oral   Take 17 g by mouth every other day.         . traMADol (ULTRAM) 50 MG tablet   Oral   Take 1-2 tablets (50-100 mg total) by mouth every  6 (six) hours as needed for moderate pain or severe pain.   40 tablet   0    There were no vitals taken for this visit. Physical Exam  Nursing note and vitals reviewed. Constitutional: He is oriented to person, place, and time. He appears well-developed and well-nourished. No distress.  Sleeping but awakens to voice  HENT:  Head: Normocephalic. Head is with laceration.    Mouth/Throat: Oropharynx is clear and moist.  5cm laceration with bleeding controlled  Eyes: Conjunctivae and EOM are normal. Pupils are equal, round, and reactive to light.  Neck: Neck supple. No spinous process tenderness and no muscular tenderness present.  c-collar in place  Cardiovascular: Normal rate, regular rhythm and intact distal pulses.   No murmur heard. Pulmonary/Chest: Effort normal and breath sounds normal. No respiratory distress. He has no wheezes. He has no rales.   Abdominal: Soft. He exhibits no distension. There is no tenderness. There is no rebound and no guarding.  Musculoskeletal: Normal range of motion. He exhibits no edema and no tenderness.  Neurological: He is alert and oriented to person, place, and time.  Skin: Skin is warm and dry. No rash noted. No erythema.  Psychiatric: He has a normal mood and affect. His behavior is normal.    ED Course  Procedures (including critical care time) Labs Review Labs Reviewed - No data to display Imaging Review No results found.   EKG Interpretation None     LACERATION REPAIR Performed by: Gwyneth Sprout Authorized byGwyneth Sprout Consent: Verbal consent obtained. Risks and benefits: risks, benefits and alternatives were discussed Consent given by: patient Patient identity confirmed: provided demographic data Prepped and Draped in normal sterile fashion Wound explored  Laceration Location: left scalp in the parietal region  Laceration Length: 5cm  No Foreign Bodies seen or palpated  Anesthesia: local infiltration  Local anesthetic: lidocaine 1% with epinephrine  Anesthetic total: 5 ml  Irrigation method: syringe Amount of cleaning: standard  Skin closure: staples  Number of sutures: 7  Technique: staples  Patient tolerance: Patient tolerated the procedure well with no immediate complications.  MDM   Final diagnoses:  None    Patient with hx of TBI who lives at a group home who while going to the bathroom at 6 AM fell and struck his head on the corner of a wall. He has a 5 cm laceration on his left parietal. He is drowsy but awakes easily and answers questions however unknown if reliable. He denies any pain and the only notable injuries to his head. He does move all 4 extremities. His pupils are reactive. He does not take any anti-coagulation.  However given patient is an unreliable historian and unclear if he had LOC from the fall head and C-spine CT ordered.  Tetanus updated and lac repaired as above.  8:04 AM Pt's scans are neg.  Mildly hypotensive today at 90/50 but was 135 systolic prior to coming and was given norvasc/lisinopril prior to coming.  Pt is now awake and alert.  He is speaking and appears in NAD.  He was d/ced home.  Gwyneth Sprout, MD 04/01/13 [phone]

## 2013-04-01 NOTE — ED Notes (Signed)
Patient transported to CT 

## 2013-04-01 NOTE — ED Notes (Signed)
Pt fell while going to bathroom at 0600, struck back of head on corner of wall,  Has a laceration with controlled bleeding,  Pt has history of traumatic brain injury, pt is from group home

## 2013-04-01 NOTE — ED Notes (Signed)
Bed: [id number] Expected date:  Expected time:  Means of arrival:  Comments: 63 yo M  Fall  Head lac

## 2013-04-01 NOTE — ED Notes (Signed)
Group home employee is here to transport pt back to group home. He requested to do this himself and not call EMS for transport

## 2014-06-27 ENCOUNTER — Emergency Department (HOSPITAL_COMMUNITY)
Admission: EM | Admit: 2014-06-27 | Discharge: 2014-06-28 | Disposition: A | Discharge status: Home / Self Care | Payer: Medicare Other | Attending: Emergency Medicine | Admitting: Emergency Medicine

## 2014-06-27 ENCOUNTER — Encounter (HOSPITAL_COMMUNITY): Payer: Self-pay | Admitting: Emergency Medicine

## 2014-06-27 DIAGNOSIS — K644 Residual hemorrhoidal skin tags: Secondary | ICD-10-CM | POA: Diagnosis not present

## 2014-06-27 DIAGNOSIS — K648 Other hemorrhoids: Secondary | ICD-10-CM | POA: Diagnosis not present

## 2014-06-27 DIAGNOSIS — F419 Anxiety disorder, unspecified: Secondary | ICD-10-CM | POA: Insufficient documentation

## 2014-06-27 DIAGNOSIS — Z87828 Personal history of other (healed) physical injury and trauma: Secondary | ICD-10-CM | POA: Diagnosis not present

## 2014-06-27 DIAGNOSIS — Z79899 Other long term (current) drug therapy: Secondary | ICD-10-CM | POA: Insufficient documentation

## 2014-06-27 DIAGNOSIS — I10 Essential (primary) hypertension: Secondary | ICD-10-CM | POA: Insufficient documentation

## 2014-06-27 DIAGNOSIS — K625 Hemorrhage of anus and rectum: Secondary | ICD-10-CM | POA: Diagnosis not present

## 2014-06-27 LAB — COMPREHENSIVE METABOLIC PANEL
ALT: 14 U/L — AB (ref 17–63)
ANION GAP: 9 (ref 5–15)
AST: 20 U/L (ref 15–41)
Albumin: 3.8 g/dL (ref 3.5–5.0)
Alkaline Phosphatase: 87 U/L (ref 38–126)
BILIRUBIN TOTAL: 0.4 mg/dL (ref 0.3–1.2)
BUN: 14 mg/dL (ref 6–20)
CALCIUM: 8.8 mg/dL — AB (ref 8.9–10.3)
CHLORIDE: 102 mmol/L (ref 101–111)
CO2: 27 mmol/L (ref 22–32)
CREATININE: 1 mg/dL (ref 0.61–1.24)
GFR calc non Af Amer: >60 mL/min (ref >60)
Glucose, Bld: 90 mg/dL (ref 65–99)
Potassium: 3.8 mmol/L (ref 3.5–5.1)
Sodium: 138 mmol/L (ref 135–145)
Total Protein: 7 g/dL (ref 6.5–8.1)

## 2014-06-27 LAB — CBC WITH DIFFERENTIAL/PLATELET
BASOS ABS: 0 10*3/uL (ref 0.0–0.1)
Basophils Relative: 0 % (ref 0–1)
Eosinophils Absolute: 0.3 10*3/uL (ref 0.0–0.7)
Eosinophils Relative: 3 % (ref 0–5)
HCT: 36.8 % — ABNORMAL LOW (ref 39.0–52.0)
Hemoglobin: 12.9 g/dL — ABNORMAL LOW (ref 13.0–17.0)
Lymphocytes Relative: 31 % (ref 12–46)
Lymphs Abs: 2.8 10*3/uL (ref 0.7–4.0)
MCH: 31.2 pg (ref 26.0–34.0)
MCHC: 35.1 g/dL (ref 30.0–36.0)
MCV: 89.1 fL (ref 78.0–100.0)
Monocytes Absolute: 0.6 10*3/uL (ref 0.1–1.0)
Monocytes Relative: 6 % (ref 3–12)
Neutro Abs: 5.3 10*3/uL (ref 1.7–7.7)
Neutrophils Relative %: 60 % (ref 43–77)
Platelets: 213 10*3/uL (ref 150–400)
RBC: 4.13 MIL/uL — AB (ref 4.22–5.81)
RDW: 12.8 % (ref 11.5–15.5)
WBC: 8.9 10*3/uL (ref 4.0–10.5)

## 2014-06-27 LAB — SAMPLE TO BLOOD BANK

## 2014-06-27 NOTE — ED Notes (Signed)
Caregiver reported that pt. had a rectal bleeding this evening at group home while using the bathroom . Denies pain / respirations unlabored .

## 2014-06-27 NOTE — ED Provider Notes (Signed)
CSN: [id number]     Arrival date & time 06/27/14  2117 History   First MD Initiated Contact with Patient 06/27/14 2246     Chief Complaint  Patient presents with  . Rectal Bleeding     (Consider location/radiation/quality/duration/timing/severity/associated sxs/prior Treatment) HPI   Pt with hx TBI and constipation p/w profuse rectal bleeding following large bowel movement.  He was noted by staff to have rectal bleeding while in the shower.  Pt states "my butt hurts" but denies abdominal pain, lightheadedness.  Staff notes pt is generally able to communicate his pain and other symptoms.  Pt has had normal colonoscopy 2012.    Level V caveat for TBI causing limited communication.    Past Medical History  Diagnosis Date  . Hypertension   . Psychotic disorder   . Anxiety   . Traumatic brain injury    Past Surgical History  Procedure Laterality Date  . Colonoscopy    . Inguinal hernia repair N/A 11/28/2012    Procedure: Diagnostic right strangulated INGUINAL HERNIA repair, and oversew on colon;  Surgeon: Lodema PilotBrian Layton, DO;  Location: [facility];  Service: General;  Laterality: N/A;  . Incision and drainage perirectal abscess N/A 12/15/2012    Procedure: exam under anesthesia, complex incision and drainage of anorectal abscess, anoscopy;  Surgeon: Adolph Pollackodd J Rosenbower, MD;  Location: [facility];  Service: General;  Laterality: N/A;   No family history on file. History  Substance Use Topics  . Smoking status: Never Smoker   . Smokeless tobacco: Never Used  . Alcohol Use: No    Review of Systems  Unable to perform ROS: Other      Allergies  Review of patient's allergies indicates no known allergies.  Home Medications   Prior to Admission medications   Medication Sig Start Date End Date Taking? Authorizing Provider  amLODipine (NORVASC) 2.5 MG tablet Take 2.5 mg by mouth daily.    Historical Provider, MD  chlorproMAZINE (THORAZINE) 50 MG tablet Take 250 mg by mouth at bedtime.      Historical Provider, MD  lisinopril (PRINIVIL,ZESTRIL) 20 MG tablet Take 20 mg by mouth daily.      Historical Provider, MD  LORazepam (ATIVAN) 0.5 MG tablet Take 0.5 mg by mouth 2 (two) times daily before a meal.      Historical Provider, MD  polyethylene glycol (MIRALAX / GLYCOLAX) packet Take 17 g by mouth every other day.    Historical Provider, MD  traMADol (ULTRAM) 50 MG tablet Take 1-2 tablets (50-100 mg total) by mouth every 6 (six) hours as needed for moderate pain or severe pain. 12/01/12   Megan N Baird, PA-C   BP 141/99 mmHg  Pulse 87  Temp(Src) 97.9 F (36.6 C) (Oral)  Resp 14  Ht 5\' 9"  (1.753 m)  Wt 188 lb (85.276 kg)  BMI 27.75 kg/m2  SpO2 100% Physical Exam  Constitutional: He appears well-developed and well-nourished. No distress.  HENT:  Head: Normocephalic and atraumatic.  Neck: Neck supple.  Cardiovascular: Normal rate and regular rhythm.   Pulmonary/Chest: Effort normal and breath sounds normal. No respiratory distress. He has no wheezes. He has no rales.  Abdominal: Soft. He exhibits no distension and no mass. There is no tenderness. There is no rebound and no guarding.  Genitourinary: Rectal exam shows external hemorrhoid and internal hemorrhoid. Rectal exam shows no fissure, no tenderness and anal tone normal. Guaiac positive stool.  Prolapsed internal hemorrhoid that is easily reduced, soft, appears to have been bleeding.  Large amount of dark blood in diaper and dark wet blood on buttocks.   Neurological: He is alert. He exhibits normal muscle tone.  Pt is happy and interactive.  Frequently echos back what is said to him but also answers questions.    Skin: He is not diaphoretic.  Nursing note and vitals reviewed.   ED Course  Procedures (including critical care time) Labs Review Labs Reviewed  CBC WITH DIFFERENTIAL/PLATELET - Abnormal; Notable for the following:    RBC 4.13 (*)    Hemoglobin 12.9 (*)    HCT 36.8 (*)    All other components within  normal limits  COMPREHENSIVE METABOLIC PANEL - Abnormal; Notable for the following:    Calcium 8.8 (*)    ALT 14 (*)    All other components within normal limits  POC OCCULT BLOOD, ED - Abnormal; Notable for the following:    Fecal Occult Bld POSITIVE (*)    All other components within normal limits  OCCULT BLOOD X 1 CARD TO LAB, STOOL  SAMPLE TO BLOOD BANK    Imaging Review No results found.   EKG Interpretation None       11:56 PM Discussed pt with Dr Norlene Campbell.     MDM   Final diagnoses:  Rectal bleeding    Afebrile, nontoxic patient with rectal bleeding following large bowel movement.  Blood was found to be coming freely out of his rectum while he was showering.  He has a large amount of old blood in his diaper, no active bleeding.  On my exam it does appear that he has an internal hemorrhoid that has been bleeding.  However, he was hemoccult positive from what appeared to be green stool.  His BP is normal.  His abdominal exam is completely benign.  The hemorrhoid was no thrombosed.  He did have a completely normal colonoscopy in 2012.  Given his hx TBI and unclear ability to follow up or be able to alert others to any physical changes he was experiencing, I called [facility] for admission for this patient.  Discussed pt with Dr Julian Reil who has reviewed his records and does not believe pt would benefit from admission.  I discussed pt with Dr Norlene Campbell again who has also spoken with the patient's caregiver.  Caregiver states staff at group home will be able to do diaper checks during the night and pt will be able to follow up with PCP for repeat CBC either tomorrow or Monday.      D/C home with strict return precautions, PCP follow up.  Discussed result, findings, treatment, and follow up  With caregiver.  Caregiver given return precautions.  Caregiver verbalizes understanding and agrees with plan.         Trixie Dredge, PA-C 06/28/14 0123  Marisa Severin, MD 06/28/14 [phone]

## 2014-06-28 LAB — POC OCCULT BLOOD, ED: Fecal Occult Bld: POSITIVE — AB

## 2014-06-28 NOTE — Discharge Instructions (Signed)
Read the information below.  You may return to the Emergency Department at any time for worsening condition or any new symptoms that concern you.   If you develop continued rectal bleeding, abdominal pain, lightheadedness/dizziness, or you pass out, return to the ER immediately for a recheck.    Please recheck Lance [name]'s diaper every 3 hours for any bleeding.    Please see Dr Everlene OtherBouska (primary care provider) as soon as possible for a recheck of his blood counts.

## 2014-11-20 IMAGING — CT CT ABD-PELV W/ CM
1 of 4 series · 12 of 32 positions shown, 17 images · IV contrast (OMNIPAQUE 300)
Comparison: No priors.

CLINICAL DATA: Swollen left groin area that looks like a hernia.

EXAM:
CT ABDOMEN AND PELVIS WITH CONTRAST
TECHNIQUE: Multidetector CT imaging of the abdomen and pelvis was performed
using the standard protocol following bolus administration of
intravenous contrast.
CONTRAST:  50mL OMNIPAQUE IOHEXOL 300 MG/ML SOLN, 100mL OMNIPAQUE
IOHEXOL 300 MG/ML SOLN

[Series 2: abd/pel with · axial · 0.80mm/px · z∈[-395,-5]mm · 12 of 90 slices shown, 17 images]
[im 6/90  soft-tissue]
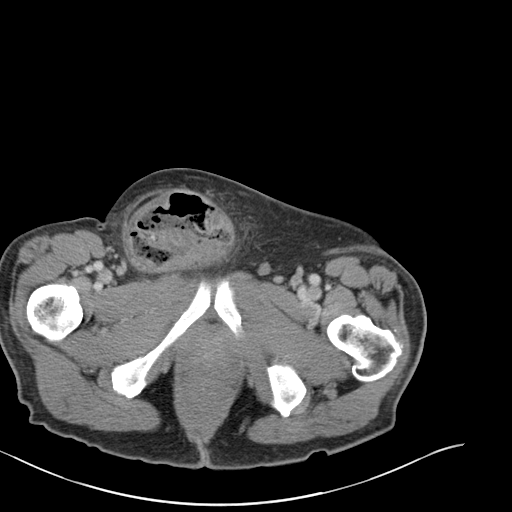
[im 6/90  bone]
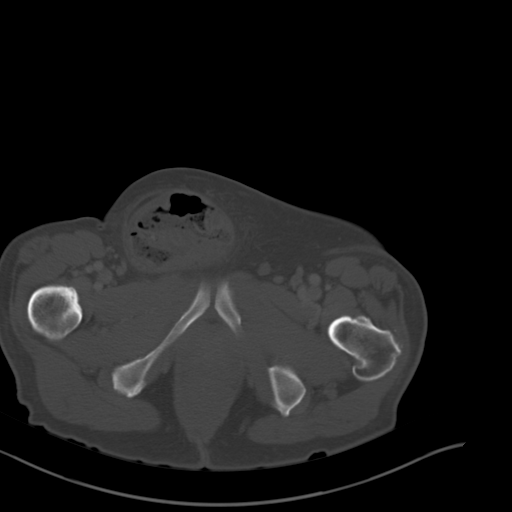
[im 12/90  soft-tissue]
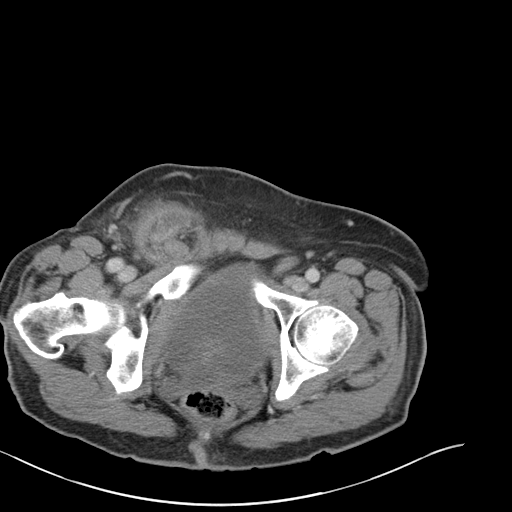
[im 24/90  soft-tissue]
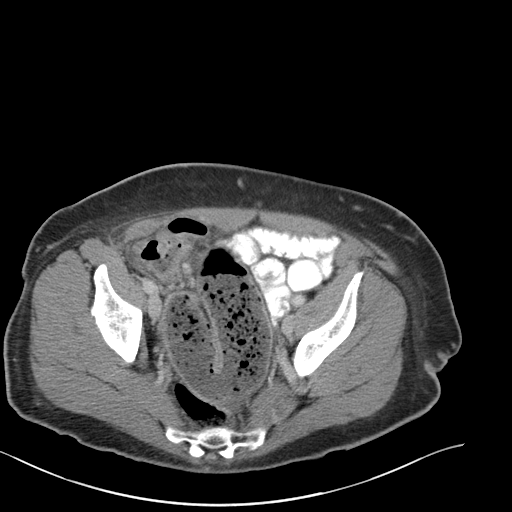
[im 30/90  soft-tissue]
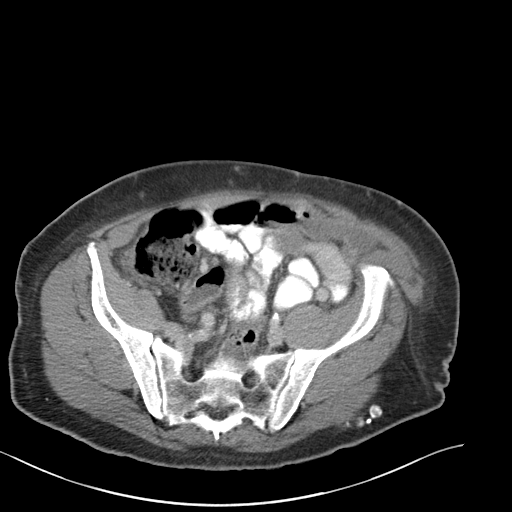
[im 36/90  soft-tissue]
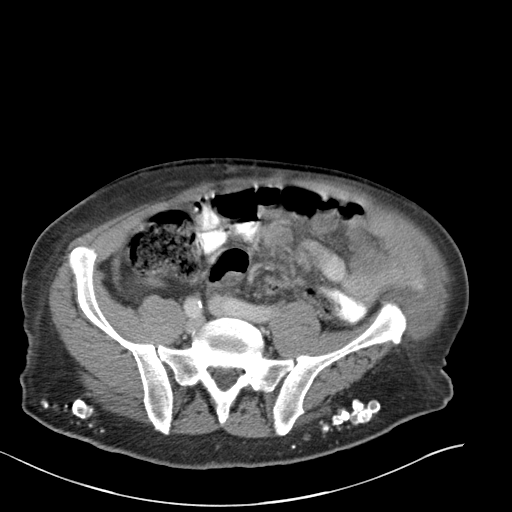
[im 48/90  soft-tissue]
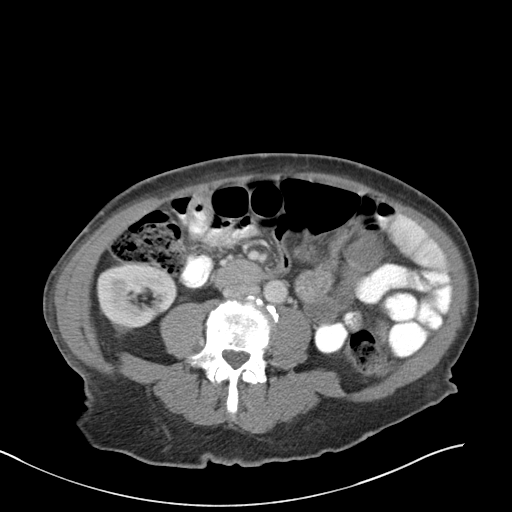
[im 54/90  soft-tissue]
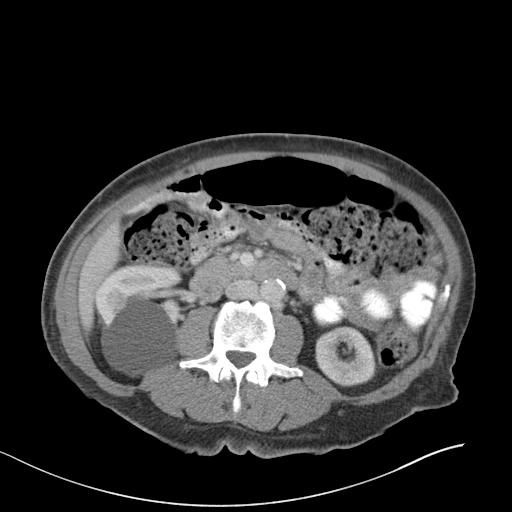
[im 60/90  soft-tissue]
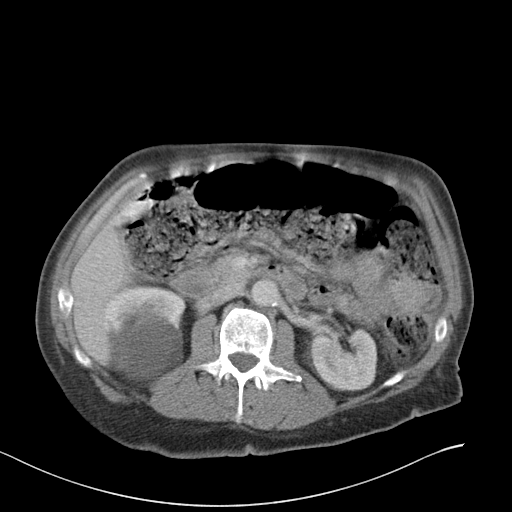
[im 66/90  soft-tissue]
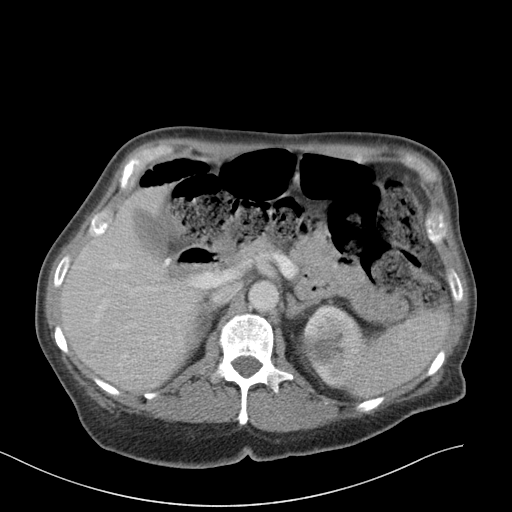
[im 66/90  lung]
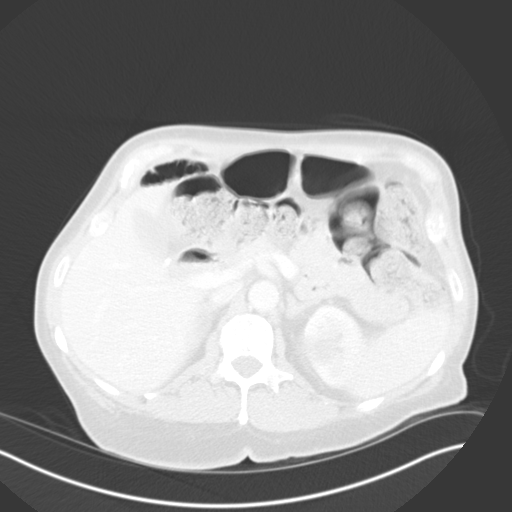
[im 66/90  bone]
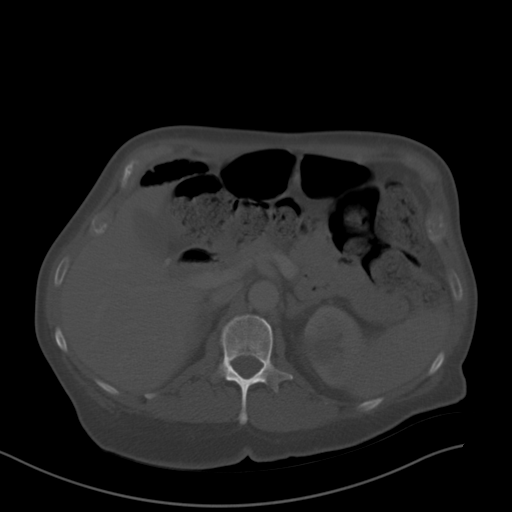
[im 72/90  lung]
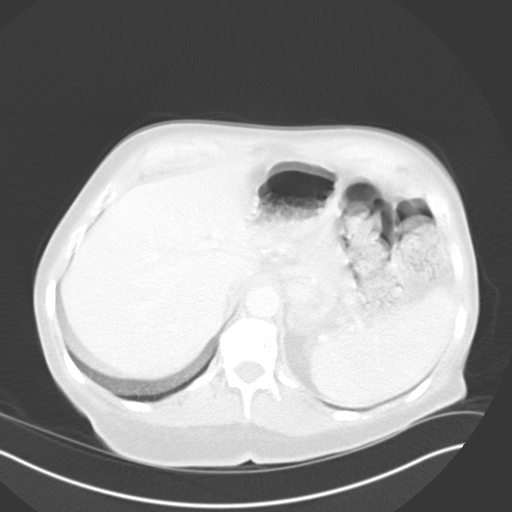
[im 78/90  soft-tissue]
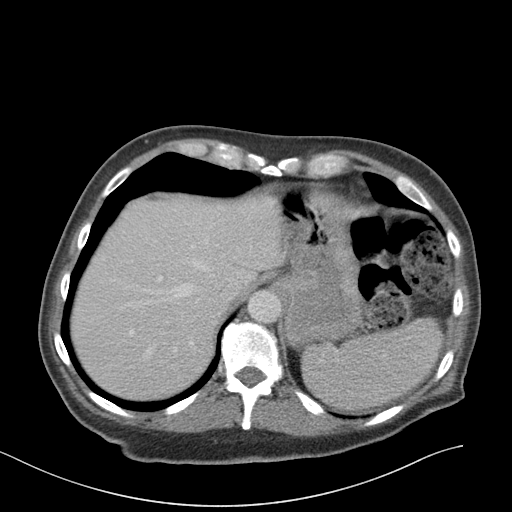
[im 78/90  lung]
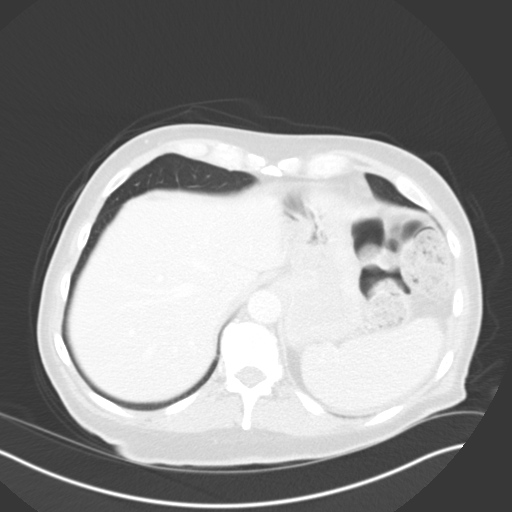
[im 84/90  soft-tissue]
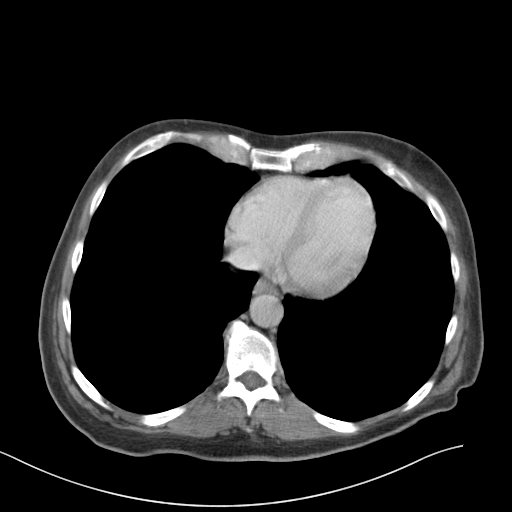
[im 84/90  lung]
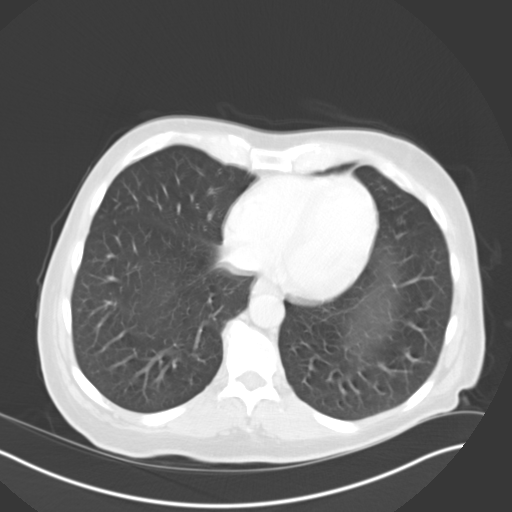

[12 of 32 positions shown; findings below may reference images not displayed]

FINDINGS: Lung Bases: Unremarkable.

Abdomen/Pelvis: Study is limited by considerable gross patient
motion and respiratory motion. With these limitations in mind, the
appearance of the liver, pancreas, spleen and bilateral adrenal
glands is unremarkable. Small calcifications dependently in the
gallbladder likely represents a gallstone. No current findings to
suggest acute cholecystitis at this time. Numerous low-attenuation
renal lesions bilaterally, largest of which are compatible with
simple cysts, with the largest cyst measuring up to 6.4 cm extending
exophytically off the posterior aspect of the upper pole of the
right kidney. Smaller sub cm low-attenuation renal lesions are too
small to definitively characterize. Retroaortic left renal vein
(normal anatomical variant) incidentally noted.

There is a large right inguinal hernia containing portions of both
the cecum and the terminal ileum. This extends into the right
hemiscrotum, where there also appears to be a moderate to large
hydrocele. Small left hydrocele also incidentally noted. Immediately
proximal to this there are dilated loops distal ileum measuring up
to 4.5 cm in diameter, which contained fecalized small bowel
contents, indicative of stasis. Proximal small bowel does not appear
dilated to suggest frank bowel obstruction at this time. These
dilated loops of distal ileum demonstrate some avid mucosal
enhancement, which could suggest some inflammation. Large volume of
well-formed stool throughout the colon suggests a background of
constipation. Trace volume of ascites in the pelvis, unusual in a
male patient. This is immediately adjacent to the dilated loop
distal ileum. No larger volume of ascites. No pneumoperitoneum.
Prostate gland and urinary bladder are unremarkable in appearance.

Musculoskeletal: There are no aggressive appearing lytic or blastic
lesions noted in the visualized portions of the skeleton. Extensive
soft tissue calcifications in the subcutaneous fat of the buttocks
regions bilaterally may represent injection granulomas.
IMPRESSION: 1. Large right inguinal hernia containing a portion of the cecum and
the terminal ileum, proximal to which the distal ileum appears
dilated, inflamed, and contains fecalized bowel contents indicative
of stasis. Although there is no overt evidence for frank bowel
obstruction at this time, these changes are concerning, particularly
in light of the small volume ascites in the pelvis. Surgical
consultation is recommended to prevent future complications.
2. Moderate to large right-sided hydrocele and small left hydrocele
incidentally noted.
3. Cholelithiasis without findings to suggest acute cholecystitis at
this time.
4. Large volume of stool throughout the colon suggests a background
of constipation.
5. Additional incidental findings, as above.

## 2015-07-07 ENCOUNTER — Ambulatory Visit (INDEPENDENT_AMBULATORY_CARE_PROVIDER_SITE_OTHER): Payer: Medicare Other | Admitting: Podiatry

## 2015-07-07 ENCOUNTER — Encounter: Payer: Self-pay | Admitting: Podiatry

## 2015-07-07 VITALS — BP 125/85 | HR 82 | Resp 17

## 2015-07-07 DIAGNOSIS — B351 Tinea unguium: Secondary | ICD-10-CM | POA: Diagnosis not present

## 2015-07-07 DIAGNOSIS — M79674 Pain in right toe(s): Secondary | ICD-10-CM | POA: Diagnosis not present

## 2015-07-07 DIAGNOSIS — M79675 Pain in left toe(s): Secondary | ICD-10-CM

## 2015-07-07 MED ORDER — NONFORMULARY OR COMPOUNDED ITEM: Status: DC

## 2015-07-07 NOTE — Progress Notes (Signed)
Subjective:     Patient ID: Lance [name], male   DOB: 1950-11-21, 65 y.o.   MRN: [id number]  HPI 65 year old male presents the office today with a caregiver for concerns of thick, painful, elongated toenails and discussed treatment options for nail fungus. Denies any drainage or pus in the toenails. The toenails causing irritation shoe gear.  Review of Systems  All other systems reviewed and are negative.      Objective:   Physical Exam General: AAO x3, NAD  Dermatological: Nails are hypertrophic, dystrophic, brittle, discolored, elongated 10 particular the right hallux toenail is very brittle and loosening underlying nail bed. There is no drainage or pus around the toenails. There is tenderness to the nails 1-5 bilaterally. No open lesions or pre-ulcerative lesions.  Vascular: Dorsalis Pedis artery and Posterior Tibial artery pedal pulses are 2/4 bilateral with immedate capillary fill time. Pedal hair growth present. There is no pain with calf compression, swelling, warmth, erythema.   Neruologic: Grossly intact via light touch bilateral. Vibratory intact via tuning fork bilateral. Protective threshold with Semmes Wienstein monofilament intact to all pedal sites bilateral.   Musculoskeletal: No pain, crepitus, or limitation noted with foot and ankle range of motion bilateral. Muscular strength 5/5 in all groups tested bilateral.  Gait: Unassisted, Nonantalgic.      Assessment:     Symptomatic onychomycosis    Plan:     -Treatment options discussed including all alternatives, risks, and complications -Etiology of symptoms were discussed -Nails debrided 10 without complications or bleeding. -Daily foot inspection -Discussed treatment options with the patient a caregiver. The start treatment for nail fungus. Order compound cream. If not covered by insurance will likely switch to Penlac. -Follow-up in 3 months or sooner if any problems arise. In the meantime, encouraged to  call the office with any questions, concerns, change in symptoms.   Ovid CurdMatthew Wagoner, DPM

## 2015-10-06 ENCOUNTER — Encounter: Payer: Self-pay | Admitting: Podiatry

## 2015-10-06 ENCOUNTER — Ambulatory Visit (INDEPENDENT_AMBULATORY_CARE_PROVIDER_SITE_OTHER): Payer: Medicare Other | Admitting: Podiatry

## 2015-10-06 DIAGNOSIS — B351 Tinea unguium: Secondary | ICD-10-CM

## 2015-10-06 DIAGNOSIS — M79674 Pain in right toe(s): Secondary | ICD-10-CM

## 2015-10-06 DIAGNOSIS — M79675 Pain in left toe(s): Secondary | ICD-10-CM | POA: Diagnosis not present

## 2015-10-07 NOTE — Progress Notes (Signed)
Subjective: 65 y.o. returns the office today for painful, elongated, thickened toenails which he cannot trim himself. Denies any redness or drainage around the nails. Denies any acute changes since last appointment and no new complaints today. Denies any systemic complaints such as fevers, chills, nausea, vomiting.   Objective: AAO 3, NAD DP/PT pulses palpable, CRT less than 3 seconds Nails hypertrophic, dystrophic, elongated, brittle, discolored 10. There is tenderness overlying the nails 1-5 bilaterally. There is no surrounding erythema or drainage along the nail sites. No open lesions or pre-ulcerative lesions are identified. No other areas of tenderness bilateral lower extremities. No overlying edema, erythema, increased warmth. No pain with calf compression, swelling, warmth, erythema.  Assessment: Patient presents with symptomatic onychomycosis  Plan: -Treatment options including alternatives, risks, complications were discussed -Nails sharply debrided 10 without complication/bleeding. -Discussed daily foot inspection. If there are any changes, to call the office immediately.  -Follow-up in 3 months or sooner if any problems are to arise. In the meantime, encouraged to call the office with any questions, concerns, changes symptoms.  Ovid CurdMatthew [place], DPM

## 2016-01-05 ENCOUNTER — Ambulatory Visit: Payer: Medicare Other | Admitting: Podiatry

## 2018-05-10 ENCOUNTER — Telehealth: Payer: Self-pay | Admitting: Gastroenterology

## 2018-05-10 NOTE — Telephone Encounter (Signed)
Patient is referred over to our office for rectal bleeding. I spoke to [facility] at the Nursing home where he is at. Patient has seen Dr. Christella Hartigan in the past. Please advise for scheduling for a phone visit.

## 2018-05-11 NOTE — Telephone Encounter (Signed)
Eldridge Abrahams, case manager for pt returned your call.

## 2018-05-11 NOTE — Telephone Encounter (Signed)
Spoke to caretaker Eldridge Abrahams. Received information to update patient chart for phone visit 05/15/18

## 2018-05-11 NOTE — Telephone Encounter (Signed)
A phone visit is fine

## 2018-05-15 ENCOUNTER — Other Ambulatory Visit: Payer: Self-pay

## 2018-05-15 ENCOUNTER — Encounter: Payer: Self-pay | Admitting: Gastroenterology

## 2018-05-15 ENCOUNTER — Ambulatory Visit (INDEPENDENT_AMBULATORY_CARE_PROVIDER_SITE_OTHER): Payer: Medicare Other | Admitting: Gastroenterology

## 2018-05-15 VITALS — BP 110/70 | HR 93 | Ht 68.0 in | Wt 209.0 lb

## 2018-05-15 DIAGNOSIS — K625 Hemorrhage of anus and rectum: Secondary | ICD-10-CM

## 2018-05-15 NOTE — Patient Instructions (Signed)
We will arrange a colonoscopy for next Tuesday, April 28 for rectal bleeding  CBC today or tomorrow for rectal bleeding  Contact person is Joni Reining who is his case manager, her phone number is [phone]- [phone]

## 2018-05-15 NOTE — Progress Notes (Signed)
This service was provided via virtual visit. Only audio was used.  The patient was located at his nursing home.  I was located in my office.  I spoke with his case manager throughout this visit, her name is Joni Reining.  He has traumatic brain injury and was unable to give any of his history.  I last saw him 8 years ago at the time of a colonoscopy, as this is a new complaint for him.  My certified medical assistant contributed to this visit by contacting the patient by phone 1 or 2 business days prior to the appointment and also followed up on the recommendations I made after the visit.  Time spent on virtual visit: 41 min   HPI: This is a 68 year old man with traumatic brain injury.  He is unable to give any history and so I spoke with Joni Reining who is his long-term case Production designer, theatre/television/film.  She knows really everything about his health and his history.  Colonoscopy May 2011 for routine risk colon cancer screening was a very poor prep.  I recommended repeating the examination in 1 year for better prep.  Colonoscopy June 2012, great prep, normal examination.  I recommended repeat colon cancer screening at 10-year interval   Labs reviewed through care everywhere, most recent CBC at [facility] June 2019 was normal except for slightly elevated white blood cell count.  His hemoglobin was 14.6.  He is here today for a new problem.  He has chronic constipation, on meds for it (miralax). Starting 2-3 weeks ago he began to have loose stools and intermittent bleeding.  He is having bleeding BMs 2-3 per week.  He forces BMs.  The case manager tells me that 1 of these episodes was pretty severe bleeding, it was described as menstrual in volume.  Overall stable weight.  He is very ambulatory.  He is not really very aware of the bleeding, has traumatic brain injury.  He has no obvious pains however.  No had labs yet.   Chief complaint is rectal bleeding  ROS: complete GI ROS as described in HPI, all other review  negative.  Constitutional:  No unintentional weight loss   Past Medical History:  Diagnosis Date  . Anxiety   . Hypertension   . Psychotic disorder (HCC)   . Traumatic brain injury [facility])     Past Surgical History:  Procedure Laterality Date  . COLONOSCOPY    . INCISION AND DRAINAGE PERIRECTAL ABSCESS N/A 12/15/2012   Procedure: exam under anesthesia, complex incision and drainage of anorectal abscess, anoscopy;  Surgeon: Adolph Pollack, MD;  Location: [facility] ORS;  Service: General;  Laterality: N/A;  . INGUINAL HERNIA REPAIR N/A 11/28/2012   Procedure: Diagnostic right strangulated INGUINAL HERNIA repair, and oversew on colon;  Surgeon: Lodema Pilot, DO;  Location: [facility];  Service: General;  Laterality: N/A;    Current Outpatient Medications  Medication Sig Dispense Refill  . amLODipine (NORVASC) 2.5 MG tablet Take 2.5 mg by mouth daily.    . chlorproMAZINE (THORAZINE) 50 MG tablet Take 250 mg by mouth at bedtime.     Marland Kitchen lisinopril (PRINIVIL,ZESTRIL) 20 MG tablet Take 20 mg by mouth daily.      Marland Kitchen LORazepam (ATIVAN) 0.5 MG tablet Take 0.5 mg by mouth 2 (two) times daily before a meal.      . polyethylene glycol (MIRALAX / GLYCOLAX) packet Take 17 g by mouth every other day.    . traMADol (ULTRAM) 50 MG tablet Take 1-2  tablets (50-100 mg total) by mouth every 6 (six) hours as needed for moderate pain or severe pain. 40 tablet 0   No current facility-administered medications for this visit.     Allergies as of 05/15/2018  . (No Known Allergies)    Family History  Problem Relation Age of Onset  . Diabetes Mother   . Hypertension Mother   . Hypertension Brother     Social History   Socioeconomic History  . Marital status: Single    Spouse name: Not on file  . Number of children: Not on file  . Years of education: Not on file  . Highest education level: Not on file  Occupational History  . Not on file  Social Needs  . Financial resource strain: Not on file  . Food  insecurity:    Worry: Not on file    Inability: Not on file  . Transportation needs:    Medical: Not on file    Non-medical: Not on file  Tobacco Use  . Smoking status: Never Smoker  . Smokeless tobacco: Never Used  Substance and Sexual Activity  . Alcohol use: No  . Drug use: No  . Sexual activity: Not on file  Lifestyle  . Physical activity:    Days per week: Not on file    Minutes per session: Not on file  . Stress: Not on file  Relationships  . Social connections:    Talks on phone: Not on file    Gets together: Not on file    Attends religious service: Not on file    Active member of club or organization: Not on file    Attends meetings of clubs or organizations: Not on file    Relationship status: Not on file  . Intimate partner violence:    Fear of current or ex partner: Not on file    Emotionally abused: Not on file    Physically abused: Not on file    Forced sexual activity: Not on file  Other Topics Concern  . Not on file  Social History Narrative  . Not on file     Physical Exam: Unable to perform because this was a "telemed visit" due to current Covid-19 pandemic BP 110/70   Pulse 93   Ht 5\' 8"  (1.727 m)   Wt 209 lb (94.8 kg)   BMI 31.78 kg/m    Assessment and plan: 68 y.o. male with rectal bleeding  For the past 2 or 3 weeks he has been having what sounds like fairly minor rectal bleeding but it has been going on every 2 to 3 days persistently.  Sometimes described as high-volume.  The history was all taken by talking with his long-term caregiver since he has traumatic brain injury and is unable to give any history.  He has no abdominal pains that she can tell.  She cannot definitively say if he has hemorrhoids causing the bleeding.  I recommended further evaluation, it has been 8 years since his last colonoscopy I recommended another one now to check for the source of bleeding I would like to do that for him sometime early next week.  He also needs a  CBC to check to see if he is anemic.  Please see the "Patient Instructions" section for addition details about the plan.  Rob Buntinganiel Jacobs, MD [facility] 05/15/2018, 3:58 PM

## 2018-05-16 ENCOUNTER — Other Ambulatory Visit (INDEPENDENT_AMBULATORY_CARE_PROVIDER_SITE_OTHER): Payer: Medicare Other

## 2018-05-16 DIAGNOSIS — K625 Hemorrhage of anus and rectum: Secondary | ICD-10-CM

## 2018-05-16 LAB — CBC WITH DIFFERENTIAL/PLATELET
Basophils Absolute: 0 10*3/uL (ref 0.0–0.1)
Basophils Relative: 0.2 % (ref 0.0–3.0)
Eosinophils Absolute: 0.2 10*3/uL (ref 0.0–0.7)
Eosinophils Relative: 1.5 % (ref 0.0–5.0)
HCT: 42.4 % (ref 39.0–52.0)
Hemoglobin: 14.5 g/dL (ref 13.0–17.0)
Lymphocytes Relative: 55.8 % — ABNORMAL HIGH (ref 12.0–46.0)
Lymphs Abs: 8.5 10*3/uL — ABNORMAL HIGH (ref 0.7–4.0)
MCHC: 34.1 g/dL (ref 30.0–36.0)
MCV: 89.9 fl (ref 78.0–100.0)
Monocytes Absolute: 0.4 10*3/uL (ref 0.1–1.0)
Monocytes Relative: 2.8 % — ABNORMAL LOW (ref 3.0–12.0)
Neutro Abs: 6 10*3/uL (ref 1.4–7.7)
Neutrophils Relative %: 39.7 % — ABNORMAL LOW (ref 43.0–77.0)
Platelets: 159 10*3/uL (ref 150.0–400.0)
RBC: 4.71 Mil/uL (ref 4.22–5.81)
RDW: 13.1 % (ref 11.5–15.5)
WBC: 15.2 10*3/uL — ABNORMAL HIGH (ref 4.0–10.5)

## 2018-05-22 ENCOUNTER — Telehealth: Payer: Self-pay | Admitting: *Deleted

## 2018-05-22 NOTE — Telephone Encounter (Signed)
Covid-19 travel screening questions Spoke with his case manager Joni Reining Have you traveled in the last 14 days?no If yes where?  Do you now or have you had a fever in the last 14 days?no  Do you have any respiratory symptoms of shortness of breath or cough now or in the last 14 days?no  Do you have any family members or close contacts with diagnosed or suspected Covid-19?no  His case manager, Joni Reining, will be with him for his procedure- she needs to be with him to help with medication and medical hx.

## 2018-05-23 ENCOUNTER — Encounter: Payer: Self-pay | Admitting: Gastroenterology

## 2018-05-23 ENCOUNTER — Other Ambulatory Visit: Payer: Self-pay

## 2018-05-23 ENCOUNTER — Ambulatory Visit (AMBULATORY_SURGERY_CENTER): Payer: Medicare Other | Admitting: Gastroenterology

## 2018-05-23 VITALS — BP 136/92 | HR 79 | Temp 98.3°F | Resp 23 | Ht 68.0 in | Wt 209.0 lb

## 2018-05-23 DIAGNOSIS — K625 Hemorrhage of anus and rectum: Secondary | ICD-10-CM

## 2018-05-23 DIAGNOSIS — K649 Unspecified hemorrhoids: Secondary | ICD-10-CM

## 2018-05-23 MED ORDER — SODIUM CHLORIDE 0.9 % IV SOLN: 500.0000 mL | Freq: Once | INTRAVENOUS | Status: DC

## 2018-05-23 NOTE — Op Note (Signed)
[facility] Patient Name: Lance [name] Procedure Date: 05/23/2018 8:19 AM MRN: [id number] Endoscopist: Rachael Feeaniel P Burak Zerbe , MD Age: 68 Referring MD:  Date of Birth: December 23, 1950 Gender: Male Account #: [id number] Procedure:                Colonoscopy Indications:              Hematochezia, not anemic. Last colonoscopy 8-9                            years ago. Medicines:                Monitored Anesthesia Care Procedure:                Pre-Anesthesia Assessment:                           - Prior to the procedure, a History and Physical                            was performed, and patient medications and                            allergies were reviewed. The patient's tolerance of                            previous anesthesia was also reviewed. The risks                            and benefits of the procedure and the sedation                            options and risks were discussed with the patient.                            All questions were answered, and informed consent                            was obtained. Prior Anticoagulants: The patient has                            taken no previous anticoagulant or antiplatelet                            agents. ASA Grade Assessment: III - A patient with                            severe systemic disease. After reviewing the risks                            and benefits, the patient was deemed in                            satisfactory condition to undergo the procedure.  After obtaining informed consent, the colonoscope                            was passed under direct vision. Throughout the                            procedure, the patient's blood pressure, pulse, and                            oxygen saturations were monitored continuously. The                            Colonoscope was introduced through the anus and                            advanced to the the cecum, identified by                       appendiceal orifice and ileocecal valve. The                            colonoscopy was performed without difficulty. The                            patient tolerated the procedure well. The quality                            of the bowel preparation was good. The ileocecal                            valve, appendiceal orifice, and rectum were                            photographed. Scope In: 8:29:48 AM Scope Out: 8:42:25 AM Scope Withdrawal Time: 0 hours 7 minutes 4 seconds  Total Procedure Duration: 0 hours 12 minutes 37 seconds  Findings:                 The entire examined colon appeared normal on direct                            and retroflexion views.                           Small external hemorrhoids. Complications:            No immediate complications. Estimated blood loss:                            None. Estimated Blood Loss:     Estimated blood loss: none. Impression:               - The entire examined colon is normal on direct and                            retroflexion views.                           -  Small external hemorrhoids, almost certainly the                            source of his recent rectal bleeding. Apply OTC                            hemorrhoid creams as needed (such as preparation H).                           - No polyps or cancers. Recommendation:           - Patient has a contact number available for                            emergencies. The signs and symptoms of potential                            delayed complications were discussed with the                            patient. Return to normal activities tomorrow.                            Written discharge instructions were provided to the                            patient.                           - Resume previous diet.                           - Continue present medications.                           - You do not need any further colon cancer                             screening tests (including stool testing). These                            types of tests generally stop around age 36-80. Rachael Fee, MD 05/23/2018 8:48:22 AM This report has been signed electronically.

## 2018-05-23 NOTE — Progress Notes (Signed)
To PACU, VSS. Report to [email or web address] 

## 2018-05-23 NOTE — Patient Instructions (Signed)
YOU HAD AN ENDOSCOPIC PROCEDURE TODAY AT THE [facility]:   Refer to the procedure report that was given to you for any specific questions about what was found during the examination.  If the procedure report does not answer your questions, please call your gastroenterologist to clarify.  If you requested that your care partner not be given the details of your procedure findings, then the procedure report has been included in a sealed envelope for you to review at your convenience later.  YOU SHOULD EXPECT: Some feelings of bloating in the abdomen. Passage of more gas than usual.  Walking can help get rid of the air that was put into your GI tract during the procedure and reduce the bloating. If you had a lower endoscopy (such as a colonoscopy or flexible sigmoidoscopy) you may notice spotting of blood in your stool or on the toilet paper. If you underwent a bowel prep for your procedure, you may not have a normal bowel movement for a few days.  Please Note:  You might notice some irritation and congestion in your nose or some drainage.  This is from the oxygen used during your procedure.  There is no need for concern and it should clear up in a day or so.  SYMPTOMS TO REPORT IMMEDIATELY:   Following lower endoscopy (colonoscopy or flexible sigmoidoscopy):  Excessive amounts of blood in the stool  Significant tenderness or worsening of abdominal pains  Swelling of the abdomen that is new, acute  Fever of 100F or higher    ng stoolsFor urgent or emergent issues, a gastroenterologist can be reached at any hour by calling [phone].   DIET:  We do recommend a small meal at first, but then you may proceed to your regular diet.  Drink plenty of fluids but you should avoid alcoholic beverages for 24 hours.  ACTIVITY:  You should plan to take it easy for the rest of today and you should NOT DRIVE or use heavy machinery until tomorrow (because of the sedation medicines used during  the test).    FOLLOW UP: Our staff will call the number listed on your records the next business day following your procedure to check on you and address any questions or concerns that you may have regarding the information given to you following your procedure. If we do not reach you, we will leave a message.  However, if you are feeling well and you are not experiencing any problems, there is no need to return our call.  We will assume that you have returned to your regular daily activities without incident.  If any biopsies were taken you will be contacted by phone or by letter within the next 1-3 weeks.  Please call us at [phone] if you have not heard about the biopsies in 3 weeks.    SIGNATURES/CONFIDENTIALITY: You and/or your care partner have signed paperwork which will be entered into your electronic medical record.  These signatures attest to the fact that that the information above on your After Visit Summary has been reviewed and is understood.  Full responsibility of the confidentiality of this discharge information lies with you and/or your care-partner.

## 2018-05-23 NOTE — Progress Notes (Signed)
All info from case manager Eldridge Abrahams. Patient is a traumatic brain injury.

## 2018-05-24 ENCOUNTER — Telehealth: Payer: Self-pay | Admitting: *Deleted

## 2018-05-24 NOTE — Telephone Encounter (Signed)
  Follow up Call-  Call back number 05/23/2018  Post procedure Call Back phone  # [phone]  Permission to leave phone message Yes  Some recent data might be hidden    No answer, mailbox is full; unable to leave message

## 2018-06-05 ENCOUNTER — Telehealth: Payer: Self-pay

## 2018-06-05 NOTE — Telephone Encounter (Signed)
Tried to call patient for a COVID F/U at 11:45 on 06/05/2018. The patient has a mailbox that is full and cannot accept any new messages. Will try again later on today.

## 2021-02-11 ENCOUNTER — Other Ambulatory Visit: Payer: Self-pay

## 2021-02-11 ENCOUNTER — Encounter: Payer: Self-pay | Admitting: Cardiology

## 2021-02-11 ENCOUNTER — Ambulatory Visit: Payer: Medicare Other | Admitting: Cardiology

## 2021-02-11 VITALS — BP 120/77 | HR 94 | Temp 98.5°F | Resp 14 | Ht 68.0 in | Wt 220.6 lb

## 2021-02-11 DIAGNOSIS — R0609 Other forms of dyspnea: Secondary | ICD-10-CM

## 2021-02-11 DIAGNOSIS — R0602 Shortness of breath: Secondary | ICD-10-CM | POA: Insufficient documentation

## 2021-02-11 DIAGNOSIS — R600 Localized edema: Secondary | ICD-10-CM

## 2021-02-11 NOTE — Progress Notes (Signed)
Patient referred by Tracey Harries, MD for pulmonary edema, dyspnea on exertion   Subjective:   Lance [name], male    DOB: Aug 20, 1950, 71 y.o.   MRN: [id number]   Chief Complaint  Patient presents with   Acute Pulmonary Edema   New Patient (Initial Visit)    Referred by Virl Son, MD     HPI  30 y.o. 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   Patient was hospitalized in December 2022 with COVID-pneumonia.  Chest x-ray was concerning for pulmonary edema.  His BNP was normal.  Patient followed up with PCP with concerns of continued dyspnea on exertion and fatigue, he was therefore referred to our office for further evaluation.  Patient is accompanied by his caretaker today's office visit who assists in providing history.  Patient's dyspnea has significantly improved since hospitalization with COVID, fatigue has also been improving.  He is able to walk and do activities around the house without issue.  Patient's caretaker states he appears to be nearly back to baseline.  Patient also previously had leg swelling and abdominal distention which is resolved. Patient has lost 5 lbs since discharge.  Past Medical History:  Diagnosis Date   Anxiety    Hypertension    Psychotic disorder (HCC)    Traumatic brain injury     Past Surgical History:  Procedure Laterality Date   BRAIN SURGERY     COLONOSCOPY     INCISION AND DRAINAGE PERIRECTAL ABSCESS N/A 12/15/2012   Procedure: exam under anesthesia, complex incision and drainage of anorectal abscess, anoscopy;  Surgeon: Adolph Pollack, MD;  Location: [facility];  Service: General;  Laterality: N/A;   INGUINAL HERNIA REPAIR N/A 11/28/2012   Procedure: Diagnostic right strangulated INGUINAL HERNIA repair, and oversew on colon;  Surgeon: Lodema Pilot, DO;  Location: [facility];  Service: General;  Laterality: N/A;    Social History   Tobacco Use  Smoking Status  Never  Smokeless Tobacco Never    Social History   Substance and Sexual Activity  Alcohol Use No    Family History  Problem Relation Age of Onset   Heart failure Mother    Diabetes Mother    Hypertension Mother    Chronic Renal Failure Mother    Chronic Renal Failure Father    Heart failure Father    Diabetes Brother    Hypertension Brother     Current Outpatient Medications on File Prior to Visit  Medication Sig Dispense Refill   acetaminophen (TYLENOL) 325 MG tablet Take 1-2 tablets by mouth as needed.     amLODipine (NORVASC) 2.5 MG tablet Take 2.5 mg by mouth daily.     chlorproMAZINE (THORAZINE) 50 MG tablet Take 250 mg by mouth at bedtime.      lisinopril (PRINIVIL,ZESTRIL) 20 MG tablet Take 20 mg by mouth daily.       LORazepam (ATIVAN) 0.5 MG tablet Take 0.5 mg by mouth 2 (two) times daily before a meal.       polyethylene glycol (MIRALAX / GLYCOLAX) packet Take 17 g by mouth every other day.     traMADol (ULTRAM) 50 MG tablet Take 1-2 tablets (50-100 mg total) by mouth every 6 (six) hours as needed for moderate pain or severe pain. 40 tablet 0   diclofenac Sodium (VOLTAREN) 1 % GEL Apply 1 application topically 4 (four) times daily.     No current facility-administered medications on file prior  to visit.    Cardiovascular and other pertinent studies:  EKG 02/12/2020: Sinus rhythm 91 bpm  Right bundle branch block Left anterior fascicular block  CXR 01/10/2021: Mild pulmonary interstitial edema.  Recent labs: 01/14/2021: Glucose 120, BUN/Cr 21/1.1. EGFR 72. Na/K 135/3.9. Mg 1.7 H/H 11.7/33.8. MCV 88. Platelets 121 HbA1C 5.3% COVID +ve Lactic acid 1.3 BNP 39  Review of Systems  Constitutional: Negative for malaise/fatigue (improved).  Cardiovascular:  Positive for dyspnea on exertion (minimal, improving). Negative for chest pain, claudication, leg swelling (resolved), orthopnea and paroxysmal nocturnal dyspnea.       Vitals:   02/11/21 1319  BP:  120/77  Pulse: 94  Resp: 14  Temp: 98.5 F (36.9 C)  SpO2: 96%     Body mass index is 33.54 kg/m. Filed Weights   02/11/21 1319  Weight: 220 lb 9.6 oz (100.1 kg)    Objective:   Physical Exam Vitals reviewed.  Cardiovascular:     Rate and Rhythm: Normal rate and regular rhythm.     Pulses: Intact distal pulses.     Heart sounds: S1 normal and S2 normal. No murmur heard.   No gallop.  Pulmonary:     Effort: Pulmonary effort is normal. No respiratory distress.     Breath sounds: No wheezing, rhonchi or rales.  Musculoskeletal:     Right lower leg: No edema.     Left lower leg: No edema.  Neurological:     Mental Status: He is alert.       Assessment & Recommendations:   71 y.o. Caucasian male with hypertension, CKD stage II-3, obesity, mental retardation (autism), history of TBI (age of 41). Referred for evaluation of dyspnea given chest x-ray consistent with mild pulmonary edema.   Dyspnea on exertion/Pulmonary edema:  Patient symptoms have significantly improved discharge, he has essentially returned to baseline.  Suspect patient's dyspnea, fatigue, and previous chest x-ray findings were related to acute COVID-pneumonia. However given concern for pulmonary edema on previous chest x-ray and continued mild dyspnea on exertion we will obtain echocardiogram, although suspicion is low for underlying cardiac etiology.  As long as echocardiogram is without significant abnormalities we will plan to follow-up with patient as needed.  Will notify patient's caretaker of echocardiogram results.  Thank you for referring the patient to [place]. Please feel free to contact with any questions.  Patient was seen in collaboration with Dr. Virgina Jock. He also reviewed patient's chart and examined the patient. Dr. Virgina Jock is in agreement of the plan.    Alethia Berthold, PA-C 02/11/2021, 1:57 PM Office: [phone]

## 2021-02-20 ENCOUNTER — Other Ambulatory Visit: Payer: Self-pay

## 2021-02-20 ENCOUNTER — Ambulatory Visit: Payer: Medicare Other

## 2021-02-20 DIAGNOSIS — R600 Localized edema: Secondary | ICD-10-CM

## 2021-02-20 DIAGNOSIS — R0609 Other forms of dyspnea: Secondary | ICD-10-CM

## 2021-02-23 NOTE — Progress Notes (Signed)
Called pt no answer, could not leave a vm.

## 2021-02-23 NOTE — Progress Notes (Signed)
Called and spoke with patient and caregiver Joni Reining, called Abijah, NA.

## 2024-01-11 ENCOUNTER — Encounter: Payer: Self-pay | Admitting: Physician Assistant

## 2024-01-11 ENCOUNTER — Ambulatory Visit: Admitting: Physician Assistant

## 2024-01-11 DIAGNOSIS — L821 Other seborrheic keratosis: Secondary | ICD-10-CM

## 2024-01-11 DIAGNOSIS — R21 Rash and other nonspecific skin eruption: Secondary | ICD-10-CM | POA: Diagnosis not present

## 2024-01-11 MED ORDER — TRIAMCINOLONE ACETONIDE 0.1 % EX OINT: 1.0000 | TOPICAL_OINTMENT | Freq: Two times a day (BID) | CUTANEOUS | 1 refills | Status: AC | PRN

## 2024-01-11 NOTE — Progress Notes (Unsigned)
° °  New Patient Visit   Subjective  Lance [name] is a 73 y.o. male who presents for the following: Rash of legs x ~6 weeks. He is using AmLactin lotion  He has a mole on his right side. It seems to be getting a little bigger.  Accompanied by caregiver today  The following portions of the chart were reviewed this encounter and updated as appropriate: medications, allergies, medical history  Review of Systems:  No other skin or systemic complaints except as noted in HPI or Assessment and Plan.  Objective  Well appearing patient in no apparent distress; mood and affect are within normal limits.   A focused examination was performed of the following areas: Legs, abdomen, back   Relevant exam findings are noted in the Assessment and Plan.    Assessment & Plan   Rash Exam: Rash on bilateral legs  Differential diagnosis:  ***  Treatment Plan: Triamcinolone  0.1% twice daily as needed   SEBORRHEIC KERATOSIS - face and abdomen  - Stuck-on, waxy, tan-brown papules and/or plaques  - Benign-appearing - Discussed benign etiology and prognosis. - Observe - Call for any changes      RASH AND OTHER NONSPECIFIC SKIN ERUPTION   This Visit - triamcinolone  ointment (KENALOG ) 0.1 % - Apply 1 Application topically 2 (two) times daily as needed (Rash). Apply twice daily to affected areas as needed  Return if symptoms worsen or fail to improve.  I, Doyce Pan, CMA, am acting as scribe for Sparrow [place] K, PA-C.    Documentation: I have reviewed the above documentation for accuracy and completeness, and I agree with the above.  Khaniyah Bezek K, PA-C

## 2024-01-11 NOTE — Patient Instructions (Signed)

## 2024-01-15 ENCOUNTER — Encounter: Payer: Self-pay | Admitting: Physician Assistant
